# Patient Record
Sex: Female | Born: 1995 | Race: White | Hispanic: No | Marital: Married | Smoking: Never smoker
Health system: Southern US, Community
[De-identification: ages and names within clinical notes are randomized; demographics above are authoritative.]

## PROBLEM LIST (undated history)

## (undated) DIAGNOSIS — R112 Nausea with vomiting, unspecified: Secondary | ICD-10-CM

## (undated) DIAGNOSIS — T4145XA Adverse effect of unspecified anesthetic, initial encounter: Secondary | ICD-10-CM

## (undated) DIAGNOSIS — T8859XA Other complications of anesthesia, initial encounter: Secondary | ICD-10-CM

## (undated) DIAGNOSIS — R7303 Prediabetes: Secondary | ICD-10-CM

## (undated) DIAGNOSIS — E0590 Thyrotoxicosis, unspecified without thyrotoxic crisis or storm: Secondary | ICD-10-CM

## (undated) DIAGNOSIS — O26649 Intrahepatic cholestasis of pregnancy, unspecified trimester: Secondary | ICD-10-CM

## (undated) DIAGNOSIS — E282 Polycystic ovarian syndrome: Secondary | ICD-10-CM

## (undated) DIAGNOSIS — Z9889 Other specified postprocedural states: Secondary | ICD-10-CM

## (undated) HISTORY — DX: Intrahepatic cholestasis of pregnancy, unspecified trimester: O26.649

## (undated) HISTORY — PX: CHOLECYSTECTOMY: SHX55

## (undated) HISTORY — DX: Other specified postprocedural states: R11.2

## (undated) HISTORY — PX: TONSILLECTOMY: SUR1361

## (undated) HISTORY — DX: Other complications of anesthesia, initial encounter: T88.59XA

## (undated) HISTORY — PX: WISDOM TOOTH EXTRACTION: SHX21

## (undated) HISTORY — DX: Polycystic ovarian syndrome: E28.2

## (undated) HISTORY — DX: Other specified postprocedural states: Z98.890

---
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
## 2013-02-01 ENCOUNTER — Ambulatory Visit: Payer: Self-pay | Admitting: Pediatrics

## 2013-02-01 ENCOUNTER — Ambulatory Visit (INDEPENDENT_AMBULATORY_CARE_PROVIDER_SITE_OTHER): Payer: BC Managed Care – PPO | Admitting: Pediatrics

## 2013-02-01 VITALS — BP 106/80 | HR 88 | Ht 62.76 in | Wt 182.0 lb

## 2013-02-01 DIAGNOSIS — R7309 Other abnormal glucose: Secondary | ICD-10-CM

## 2013-02-01 DIAGNOSIS — R7303 Prediabetes: Secondary | ICD-10-CM

## 2013-02-01 DIAGNOSIS — E669 Obesity, unspecified: Secondary | ICD-10-CM

## 2013-02-01 DIAGNOSIS — N926 Irregular menstruation, unspecified: Secondary | ICD-10-CM

## 2013-02-01 DIAGNOSIS — Z6832 Body mass index (BMI) 32.0-32.9, adult: Secondary | ICD-10-CM

## 2013-02-01 MED ORDER — NORGESTIMATE-ETH ESTRADIOL 0.25-35 MG-MCG PO TABS: 1.0000 | ORAL_TABLET | Freq: Every day | ORAL | Status: DC

## 2013-02-01 NOTE — Progress Notes (Signed)
Adolescent Medicine Consultation Visit   History was provided by the patient and mother.  Yesenia Joseph is a 17 y.o. female who is here for evaluation for PCOS. PCP Confirmed? yes  BATES,MELISA K, MD  HPI:  Yesenia Joseph is a obese 17 y/o female with ADHD and pre-diabetes here for consultation for management of her irregular menstrual cycle and possible PCOS.  Jlynn reports her menstrual cycle starting in 7th grade, ~12-13 y/o. Reports irregular periods where she will have a period for "months" or several times a month but then will go 3 months without one.  When not prolonged will last about 1 week with cramping, back pain, and heavy bleeding.  Usually uses about 3 "overnight pads" during the day time. Her PCP has been managing her OCPs since 10th grade. Has been on "multiple" different OCPs to help regulate her peroids without any help. Was recently referred to OB/Gyn to evaluate for possible PCOS where blood work and U/S (not transvaginal) was done and Yesenia Joseph was told her U/S was "unconclusive." Currently taking Junel 1/20 OCPs (confirmed by pharmacy) and prior to that was on Ortho-Tri-Cylen.  Followed by Dr. Darnelle Bos for pre-diabetes, on Metformin 500 mg BID for 6 years.  Unknown Hgba1c.  Has long standing issues with weight management and fluxes in her weight. Reports exercising 5 days a week, was previously on the tennis team, softball team, and swim team in HS. Trying to eat pretty healthy, but does report emotional eating when stressed or when upset with not seeing results in her weight. Denies binge eating.  Never seen a nutritionist. Unknown if tested for thryoid medicine however has goiter and hyperthyroidism in her medical records and was told by Dr. Marylen Ponto that "she will eventually need thyroid medicine".  No thyroid labs to review. Mother and Eesha report worsening mood issues, gets emotional very easily, gets really upset over "anything", and can't control her emotions. Feels like "something missing"   but can't pinpoint it.  Mother feels like Yesenia Joseph is not happy with herself and not happy about her with weight.  Currently a rising sophomore at [employer], did well last year,  Dean's list both semesters but had to work very hard. Gets easily stressed and nervous regarding school and tests, does note getting panicky feelings and nauseous, SOB, heart racing and sweaty when stressed.  Works twice as hard to study and put a lot of pressure on herself.       History of dyslexia and ADHD and sensory processing.   Review of Systems:  Constitutional:   Denies fever  Vision: Denies concerns about vision  HENT: Denies concerns about hearing, snoring  Lungs:   Denies difficulty breathing  Heart:   Denies chest pain  Endocrine Denies hair loss, hair growth, acne, or temperature dysregulation.   Gastrointestinal:   Denies abdominal pain, constipation, diarrhea  Genitourinary:   Denies dysuria, discharge.   Neurologic:   Denies headaches   Patient's last menstrual period was 01/18/2013. On 3rd week, 4th day of OCP pack today.   Current Outpatient Prescriptions on File Prior to Visit  Medication Sig Dispense Refill  . lisdexamfetamine (VYVANSE) 50 MG capsule Take 50 mg by mouth every morning.      . metFORMIN (GLUCOPHAGE) 500 MG tablet Take 500 mg by mouth 2 (two) times daily with a meal.         No current facility-administered medications on file prior to visit.    Past Medical History:  Allergies  Allergen Reactions  .  Anesthetics, Amide   . Anesthetics, Ester   . Anesthetics, Halogenated    Past Medical History  Diagnosis Date  . Hyperthyroidism   . Pre-diabetes   Yesenia Joseph "never grew" and was referred to [employer] [facility]. No diagnosis.  Inconclusive on autism spectrum disorder. Started gaining weight at 17 y/o.   Family history:   Mother with endometriosis.  Maternal GGM with phlebitis and leg clots. Maternal aunt with leg clots.  24 y/o sister, Yesenia Joseph with IBS and connective tissues  disorder (blue sclera).  50 y/o sister, Yesenia Joseph with depression.    Social History: Lives at home for the summer with parents and 2 sisters.  Parental relations: good, close with mother.  Siblings: 2 sisters, 34 and 32 y/o both will be at [facility] with her. Very close.   School performance: Scientist, product/process development at [employer]  School Status: good  Nutrition/Eating Behaviors: Healthy eating behaviors, occasional emotional eating with junk food.  Sports/Exercise:  Exercises 5 days a week, lifting weights, walking, running. 3 sport athlete in HS.   With confidentiality discussed and parent out of the room:  - patient reports being comfortable and safe at school and at home.   Sexually active? No - Last STI Screening: none  - sexual partners in last year: none  - contraception use: none  - tobacco use or exposure:  Very occasional EtOH use at school, less than once a month, no binge drinking.  - historical and current drug use: none   Violence/Abuse: none   Screenings: The patient completed the Rapid Assessment for Adolescent Preventive Services screening questionnaire and the following topics were identified as risk factors and discussed: EtOH use.  In addition, the following topics were discussed as part of anticipatory guidance healthy eating, exercise.   PHQ-A completed and results listed in separate section. Suicidality was denied.   Additional Screening:   Completed PHQ-9 modified fro Adolescents (PHQ-A) Questions #1-9 score 7 Denies depression or sucidality.   Reported problems make it not difficult at all to complete activities of daily functioning.   Physical Exam:    Filed Vitals:   02/01/13 1603  BP: 106/80  Pulse: 88  Height: 5' 2.76" (1.594 m)  Weight: 182 lb (82.555 kg)   40.3% systolic and 93.5% diastolic of BP percentile by age, sex, and height.  GEN: Obese, well appearing adolescent female sitting comfortably. Easily emotional, crying with discussion of her mood.  Interactive, talkative. Good eye contact. In no acute distress.  HEENT: EOMI. Moist mucous membranes.   NECK: No thyroidmegaly or goiter appreciated. Small dorsocervical fat pad.  PULM:  Unlabored respirations.  Clear to auscultation bilaterally with no wheezes or crackles.  No accessory muscle use. CARDIO:  Regular rate and rhythm.  No murmurs.  2+ radial pulses GI:  Obese, soft, non tender, non distended.  Normoactive bowel sounds.  No masses.  No hepatosplenomegaly.   GU: Tanner Stage V female. Normal appearing external female genitalia. No discharge appreciated.   SKIN: No excessive hair growth to back, abdomen, face appreciated. No acne. NEURO: Alert and oriented. CN II-XII intact grossly. Normal gait. Normal tone. No focal deficits.   Assessment/Plan:  Aubrei is a 17 y/o adolescent female with ADHD and pre-diabetes here for management of her menstrual irregularity and concern for PCOS.   BMI 32.0-32.9,adult Difficulty with managing weight despite exercise and healthy eating, likely related to hyper insulin state as well intermittent emotional eating.   History of pre-diabetes state managed with short acting Metformin for  6 years with little improvement in weight.   Plan:  - Will get blood work today to evaluate for other contributing disease and/or complications from her pre-diabetic state including: HgbA1c, lipid panel, CMP, and TSH, and free T4.   -Refer to nutrition, Denny Levy at [facility]. - Consider further evaluation of obesity especially for Cushing's given exam findings. - Could consider Wellbutrin in the future given her history of emotional eating.     Menstrual irregularity Currently taking Junel which has a low dose of estrogen (20 mcg) that may not be supporting her bleeding and the progesterone source also is less anti-androgenic to assist with possible PCOS.   Sprintec would better benefit her due to higher estrogen (35 mcg) and a more  anti-androgenic progesterone source.   Uncertain previous work up for her PCOS, although was told it was "inconclusive." Will complete initial blood work today to evaluate for PCOS given her menstrual irregularities as well as rule out other etiologies such as prolactinoma or adrenal tumor.          Plan: - Once current OCP pack is complete, will switch to Sprintec.  - Will obtain blood work including: DHEA-S, testosterone panel, prolactin, FSH, LH.  Currently on 3rd week 4th day of OCP pack.  - Continue on current Metformin 500 mg BID, will consider increasing based on blood work.  - Discussed with mother and Aleyna the diagnosis of PCOS and further management.          - Follow-up visit in 2 weeks for blood work follow up, or sooner as needed.   Walden Field, MD [facility] Pediatric PGY-2 02/04/2013 10:57 AM  .

## 2013-02-01 NOTE — Patient Instructions (Signed)
-   We are going to switch Jarrett to a different birth control that would be better suited for possible PCOS called Sprintec.  You should continue your current birth control until you finish the pack then switch to the new Sprintec.  - We are also going to get some lab work to further investigate PCOS that will include hormone levels, hemoglobin A1c, electrolytes, lipids. - We have also made a referral to Denny Levy for nutrition, they will call to set up an appointment.   - Please return to clinic in 2 week for lab follow up with Dr. Marina Goodell.

## 2013-02-02 LAB — COMPREHENSIVE METABOLIC PANEL
ALT: 17 U/L (ref 0–35)
BUN: 11 mg/dL (ref 6–23)
CO2: 26 mEq/L (ref 19–32)
Calcium: 9.1 mg/dL (ref 8.4–10.5)
Chloride: 104 mEq/L (ref 96–112)
Creat: 0.84 mg/dL (ref 0.50–1.10)
Glucose, Bld: 77 mg/dL (ref 70–99)

## 2013-02-02 LAB — LIPID PANEL
Cholesterol: 145 mg/dL (ref 0–200)
LDL Cholesterol: 66 mg/dL (ref 0–99)
Total CHOL/HDL Ratio: 3 Ratio
VLDL: 31 mg/dL (ref 0–40)

## 2013-02-03 LAB — TESTOSTERONE, FREE: Testosterone, Free: 4.4 pg/mL (ref 0.6–6.8)

## 2013-02-03 LAB — TESTOSTERONE: Testosterone: 42 ng/dL — ABNORMAL HIGH (ref 15–40)

## 2013-02-03 LAB — TSH: TSH: 1.097 u[IU]/mL (ref 0.350–4.500)

## 2013-02-03 LAB — SEX HORMONE BINDING GLOBULIN: Sex Hormone Binding: 74 nmol/L (ref 18–114)

## 2013-02-04 ENCOUNTER — Encounter: Payer: Self-pay | Admitting: Pediatrics

## 2013-02-04 DIAGNOSIS — Z6832 Body mass index (BMI) 32.0-32.9, adult: Secondary | ICD-10-CM | POA: Insufficient documentation

## 2013-02-04 DIAGNOSIS — N926 Irregular menstruation, unspecified: Secondary | ICD-10-CM | POA: Insufficient documentation

## 2013-02-04 NOTE — Assessment & Plan Note (Addendum)
Currently taking Junel which has a low dose of estrogen (20 mcg) that may not be supporting her bleeding and the progesterone source also is less anti-androgenic to assist with possible PCOS.   Sprintec would better benefit her due to higher estrogen (35 mcg) and a more anti-androgenic progesterone source.   Uncertain previous work up for her PCOS, although was told it was "inconclusive." Will complete initial blood work today to evaluate for PCOS given her menstrual irregularities as well as rule out other etiologies such as prolactinoma or adrenal tumor.          Plan: - Once current OCP pack is complete, will switch to Sprintec.  - Will obtain blood work including: DHEA-S, testosterone panel, prolactin, FSH, LH.  Currently on 3rd week 4th day of OCP pack.  - Continue on current Metformin 500 mg BID, will consider increasing based on blood work.  - Discussed with mother and Oyuki the diagnosis of PCOS and further management.

## 2013-02-04 NOTE — Assessment & Plan Note (Addendum)
Difficulty with managing weight despite exercise and healthy eating, likely related to hyper insulin state as well intermittent emotional eating.   History of pre-diabetes state managed with short acting Metformin for 6 years with little improvement in weight.   Plan:  - Will get blood work today to evaluate for other contributing disease and/or complications from her pre-diabetic state including: HgbA1c, lipid panel, CMP, and TSH, and free T4.   -Refer to nutrition, Denny Levy at [facility]. - Consider further evaluation of obesity especially for Cushing's given exam findings. - Could consider Wellbutrin in the future given her history of emotional eating.

## 2013-02-07 NOTE — Progress Notes (Signed)
Referral and notes faxed to [facility] They will contact patient to schedule appointment.

## 2013-02-14 NOTE — Progress Notes (Signed)
I saw and evaluated the patient, performing the key elements of the service.  I developed the management plan that is described in the resident's note, and I agree with the content. 

## 2013-02-17 ENCOUNTER — Encounter: Payer: Self-pay | Admitting: Pediatrics

## 2013-02-17 ENCOUNTER — Ambulatory Visit (INDEPENDENT_AMBULATORY_CARE_PROVIDER_SITE_OTHER): Payer: BC Managed Care – PPO | Admitting: Pediatrics

## 2013-02-17 VITALS — BP 100/66 | Wt 186.0 lb

## 2013-02-17 DIAGNOSIS — E282 Polycystic ovarian syndrome: Secondary | ICD-10-CM | POA: Insufficient documentation

## 2013-02-17 MED ORDER — METFORMIN HCL ER (OSM) 1000 MG PO TB24: 1000.0000 mg | ORAL_TABLET | Freq: Every day | ORAL | Status: DC

## 2013-02-17 NOTE — Patient Instructions (Signed)
More information can be found at [email or web address]  Polycystic Ovarian Syndrome Polycystic ovarian syndrome is a condition with a number of problems. One problem is with the ovaries. The ovaries are organs located in the female pelvis, on each side of the uterus. Usually, during the menstrual cycle, an egg is released from 1 ovary every month. This is called ovulation. When the egg is fertilized, it goes into the womb (uterus), which allows for the growth of a baby. The egg travels from the ovary through the fallopian tube to the uterus. The ovaries also make the hormones estrogen and progesterone. These hormones help the development of a woman's breasts, body shape, and body hair. They also regulate the menstrual cycle and pregnancy. Sometimes, cysts form in the ovaries. A cyst is a fluid-filled sac. On the ovary, different types of cysts can form. The most common type of ovarian cyst is called a functional or ovulation cyst. It is normal, and often forms during the normal menstrual cycle. Each month, a woman's ovaries grow tiny cysts that hold the eggs. When an egg is fully grown, the sac breaks open. This releases the egg. Then, the sac which released the egg from the ovary dissolves. In one type of functional cyst, called a follicle cyst, the sac does not break open to release the egg. It may actually continue to grow. This type of cyst usually disappears within 1 to 3 months.  One type of cyst problem with the ovaries is called Polycystic Ovarian Syndrome (PCOS). In this condition, many follicle cysts form, but do not rupture and produce an egg. This health problem can affect the following:  Menstrual cycle.  Heart.  Obesity.  Cancer of the uterus.  Fertility.  Blood vessels.  Hair growth (face and body) or baldness.  Hormones.  Appearance.  High blood pressure.  Stroke.  Insulin production.  Inflammation of the liver.  Elevated blood cholesterol and triglycerides. CAUSES     No one knows the exact cause of PCOS.  Women with PCOS often have a mother or sister with PCOS. There is not yet enough proof to say this is inherited.  Many women with PCOS have a weight problem.  Researchers are looking at the relationship between PCOS and the body's ability to make insulin. Insulin is a hormone that regulates the change of sugar, starches, and other food into energy for the body's use, or for storage. Some women with PCOS make too much insulin. It is possible that the ovaries react by making too many female hormones, called androgens. This can lead to acne, excessive hair growth, weight gain, and ovulation problems.  Too much production of luteinizing hormone (LH) from the pituitary gland in the brain stimulates the ovary to produce too much female hormone (androgen). SYMPTOMS   Infrequent or no menstrual periods, and/or irregular bleeding.  Inability to get pregnant (infertility), because of not ovulating.  Increased growth of hair on the face, chest, stomach, back, thumbs, thighs, or toes.  Acne, oily skin, or dandruff.  Pelvic pain.  Weight gain or obesity, usually carrying extra weight around the waist.  Type 2 diabetes (this is the diabetes that usually does not need insulin).  High cholesterol.  High blood pressure.  Female-pattern baldness or thinning hair.  Patches of thickened and dark brown or black skin on the neck, arms, breasts, or thighs.  Skin tags, or tiny excess flaps of skin, in the armpits or neck area.  Sleep apnea (excessive snoring and breathing stops at  times while asleep).  Deepening of the voice.  Gestational diabetes when pregnant.  Increased risk of miscarriage with pregnancy. DIAGNOSIS  There is no single test to diagnose PCOS.   Your caregiver will:  Take a medical history.  Perform a pelvic exam.  Perform an ultrasound.  Check your female and female hormone levels.  Measure glucose or sugar levels in the blood.  Do  other blood tests.  If you are producing too many female hormones, your caregiver will make sure it is from PCOS. At the physical exam, your caregiver will want to evaluate the areas of increased hair growth. Try to allow natural hair growth for a few days before the visit.  During a pelvic exam, the ovaries may be enlarged or swollen by the increased number of small cysts. This can be seen more easily by vaginal ultrasound or screening, to examine the ovaries and lining of the uterus (endometrium) for cysts. The uterine lining may become thicker, if there has not been a regular period. TREATMENT  Because there is no cure for PCOS, it needs to be managed to prevent problems. Treatments are based on your symptoms. Treatment is also based on whether you want to have a baby or whether you need contraception.  Treatment may include:  Progesterone hormone, to start a menstrual period.  Birth control pills, to make you have regular menstrual periods.  Medicines to make you ovulate, if you want to get pregnant.  Medicines to control your insulin.  Medicine to control your blood pressure.  Medicine and diet, to control your high cholesterol and triglycerides in your blood.  Surgery, making small holes in the ovary, to decrease the amount of female hormone production. This is done through a long, lighted tube (laparoscope), placed into the pelvis through a tiny incision in the lower abdomen. Your caregiver will go over some of the choices with you. WOMEN WITH PCOS HAVE THESE CHARACTERISTICS:  High levels of female hormones called androgens.  An irregular or no menstrual cycle.  May have many small cysts in their ovaries. PCOS is the most common hormonal reproductive problem in women of childbearing age. WHY DO WOMEN WITH PCOS HAVE TROUBLE WITH THEIR MENSTRUAL CYCLE? Each month, about 20 eggs start to mature in the ovaries. As one egg grows and matures, the follicle breaks open to release the egg, so  it can travel through the fallopian tube for fertilization. When the single egg leaves the follicle, ovulation takes place. In women with PCOS, the ovary does not make all of the hormones it needs for any of the eggs to fully mature. They may start to grow and accumulate fluid, but no one egg becomes large enough. Instead, some may remain as cysts. Since no egg matures or is released, ovulation does not occur and the hormone progesterone is not made. Without progesterone, a woman's menstrual cycle is irregular or absent. Also, the cysts produce female hormones, which continue to prevent ovulation.  Document Released: 10/23/2004 Document Revised: 09/21/2011 Document Reviewed: 05/17/2009 [facility] Patient Information 2014 [place], [place].

## 2013-02-17 NOTE — Progress Notes (Signed)
I saw and evaluated the patient, performing the key elements of the service.  I developed the management plan that is described in the resident's note, and I agree with the content. 

## 2013-02-17 NOTE — Progress Notes (Signed)
Adolescent Medicine Consultation Follow-Up Visit Yesenia Joseph was referred by PCP for evaluation of possible PCOS  [name], MD PCP Confirmed?  yes   History was provided by the patient and mother.  Yesenia Joseph is a 17 y.o. female who is here today for follow-up after lab work last week.    HPI:  History of irregular periods and insulin resistance, has been on OCPs and metformin for several years.  Has gotten Sprintec but not started yet.  Interested in switching to once daily metformin dosing.  No new complaints or issues.  Will be returning to college next week.  Review of Systems:  Constitutional:   Denies fever  Vision: Denies concerns about vision  HENT: Denies concerns about hearing, snoring  Lungs:   Denies difficulty breathing  Heart:   Denies chest pain  Gastrointestinal:   Denies abdominal pain, constipation, diarrhea  Genitourinary:   Denies dysuria  Neurologic:   Denies headaches   Social History: Pregnancy Prevention: OCPs Menstrual History: Patient's last menstrual period was 01/18/2013.    Patient Active Problem List   Diagnosis Date Noted  . Menstrual irregularity 02/04/2013  . BMI 32.0-32.9,adult 02/04/2013  . Pre-diabetes 12/01/2010  . Goiter, unspecified 12/01/2010  . Obesity 12/01/2010    Current Outpatient Prescriptions on File Prior to Visit  Medication Sig Dispense Refill  . lisdexamfetamine (VYVANSE) 50 MG capsule Take 50 mg by mouth every morning.      . norgestimate-ethinyl estradiol (SPRINTEC 28) 0.25-35 MG-MCG tablet Take 1 tablet by mouth daily.  1 Package  11   No current facility-administered medications on file prior to visit.    The following portions of the patient's history were reviewed and updated as appropriate: allergies, current medications, past family history, past medical history, past social history, past surgical history and problem list.   Physical Exam:  Gen: awake, alert, interactive; no acute distress Neck:  supple without lymphadenopathy CV: RRR, no murmurs appreciated, normal peripheral pulses and perfusion Resp: Comfortable WOB without retractions.  No wheezing, rales, or rhonchi Abd: Soft, nontender, nondistended.  No palpable masses or organomegaly Skin: No rashes or lesions noted.  No acne.  Striae noted on arms. Neuro/psych: Grossly non-focal and appropriate for age.    Filed Vitals:   02/17/13 1033  BP: 100/66  Weight: 186 lb (84.369 kg)    Recent Results (from the past 2160 hour(s))  POCT I-STAT, CHEM 8     Status: None   Collection Time    12/12/12  8:07 PM      Result Value Range   Sodium 141  135 - 145 mEq/L   Potassium 4.1  3.5 - 5.1 mEq/L   Chloride 106  96 - 112 mEq/L   BUN 9  6 - 23 mg/dL   Creatinine, Ser 1.91  0.50 - 1.10 mg/dL   Glucose, Bld 89  70 - 99 mg/dL   Calcium, Ion 4.78  2.95 - 1.23 mmol/L   TCO2 29  0 - 100 mmol/L   Hemoglobin 13.3  12.0 - 15.0 g/dL   HCT 62.1  30.8 - 65.7 %  POCT PREGNANCY, URINE     Status: None   Collection Time    12/12/12  9:10 PM      Result Value Range   Preg Test, Ur NEGATIVE  NEGATIVE   Comment:            THE SENSITIVITY OF THIS     METHODOLOGY IS >24 mIU/mL  COMPREHENSIVE METABOLIC  PANEL     Status: None   Collection Time    02/02/13  3:00 PM      Result Value Range   Sodium 137  135 - 145 mEq/L   Potassium 4.3  3.5 - 5.3 mEq/L   Chloride 104  96 - 112 mEq/L   CO2 26  19 - 32 mEq/L   Glucose, Bld 77  70 - 99 mg/dL   BUN 11  6 - 23 mg/dL   Creat 2.53  6.64 - 4.03 mg/dL   Total Bilirubin 0.4  0.3 - 1.2 mg/dL   Alkaline Phosphatase 53  39 - 117 U/L   AST 18  0 - 37 U/L   ALT 17  0 - 35 U/L   Total Protein 6.5  6.0 - 8.3 g/dL   Albumin 4.2  3.5 - 5.2 g/dL   Calcium 9.1  8.4 - 47.4 mg/dL  TESTOSTERONE     Status: Abnormal   Collection Time    02/02/13  3:00 PM      Result Value Range   Testosterone 42 (*) 15 - 40 ng/dL   Comment:           Tanner Stage       Female              Female                   I               < 30 ng/dL        < 10 ng/dL                   II             < 150 ng/dL       < 30 ng/dL                   III            100-320 ng/dL     < 35 ng/dL                   IV             200-970 ng/dL     25-95 ng/dL                   V/Adult        300-890 ng/dL     63-87 ng/dL        TESTOSTERONE, FREE     Status: None   Collection Time    02/02/13  3:00 PM      Result Value Range   Testosterone, Free 4.4  0.6 - 6.8 pg/mL   Comment:       The concentration of free testosterone is derived from a mathematical     expression based on constants for the binding of testosterone to sex     hormone-binding globulin and albumin.  TESTOSTERONE, % FREE     Status: None   Collection Time    02/02/13  3:00 PM      Result Value Range   Testosterone-% Freee. 1.0  0.4 - 2.4 %  SEX HORMONE BINDING GLOBULIN     Status: None   Collection Time    02/02/13  3:00 PM      Result Value Range   Sex Hormone Binding 74  18 - 114 nmol/L  DHEA-SULFATE  Status: None   Collection Time    02/02/13  3:00 PM      Result Value Range   DHEA-SO4 245  35 - 430 ug/dL  HEMOGLOBIN J4N     Status: None   Collection Time    02/02/13  3:00 PM      Result Value Range   Hemoglobin A1C 4.6  <5.7 %   Comment:                                                                            According to the ADA Clinical Practice Recommendations for 2011, when     HbA1c is used as a screening test:             >=6.5%   Diagnostic of Diabetes Mellitus                (if abnormal result is confirmed)           5.7-6.4%   Increased risk of developing Diabetes Mellitus           References:Diagnosis and Classification of Diabetes Mellitus,Diabetes     Care,2011,34(Suppl 1):S62-S69 and Standards of Medical Care in             Diabetes - 2011,Diabetes Care,2011,34 (Suppl 1):S11-S61.         Mean Plasma Glucose 85  <117 mg/dL  LIPID PANEL     Status: Abnormal   Collection Time    02/02/13  3:00 PM      Result Value  Range   Cholesterol 145  0 - 200 mg/dL   Comment: ATP III Classification:           < 200        mg/dL        Desirable          200 - 239     mg/dL        Borderline High          >= 240        mg/dL        High         Triglycerides 153 (*) <150 mg/dL   HDL 48  >82 mg/dL   Total CHOL/HDL Ratio 3.0     VLDL 31  0 - 40 mg/dL   LDL Cholesterol 66  0 - 99 mg/dL   Comment:       Total Cholesterol/HDL Ratio:CHD Risk                            Coronary Heart Disease Risk Table                                            Men       Women              1/2 Average Risk              3.4        3.3  Average Risk              5.0        4.4               2X Average Risk              9.6        7.1               3X Average Risk             23.4       11.0     Use the calculated Patient Ratio above and the CHD Risk table      to determine the patient's CHD Risk.     ATP III Classification (LDL):           < 100        mg/dL         Optimal          100 - 129     mg/dL         Near or Above Optimal          130 - 159     mg/dL         Borderline High          160 - 189     mg/dL         High           > 190        mg/dL         Very High        PROLACTIN     Status: None   Collection Time    02/02/13  3:00 PM      Result Value Range   Prolactin 7.3     Comment:      Reference Ranges:                      Female:                       2.1 -  17.1 ng/ml                      Female:   Pregnant          9.7 - 208.5 ng/mL                                Non Pregnant      2.8 -  29.2 ng/mL                                Post Menopausal   1.8 -  20.3 ng/mL                         TSH     Status: None   Collection Time    02/02/13  3:00 PM      Result Value Range   TSH 1.097  0.350 - 4.500 uIU/mL  LUTEINIZING HORMONE     Status: None   Collection Time    02/02/13  3:00 PM      Result Value Range   LH 2.9     Comment: Reference Ranges:  Female:     20 - 70 Years            1.5 -  9.3 mIU/mL                           > 70 Years           3.1 - 34.6 mIU/mL              Female:   Follicular Phase        1.9 - 12.5 mIU/mL                        Midcycle                8.7 - 76.3 mIU/mL                        Luteal Phase            0.5 - 16.9 mIU/mL                        Post Menopausal        15.9 - 54.0 mIU/mL                        Pregnant                    <  1.5 mIU/mL                        Contraceptives          0.7 -  5.6 mIU/mL              Children:                             <  6.0 mIU/mL        FOLLICLE STIMULATING HORMONE     Status: None   Collection Time    02/02/13  3:00 PM      Result Value Range   FSH 3.7     Comment: Reference Ranges:              Female:                         1.4 -  18.1 mIU/mL              Female:   Follicular Phase    2.5 -  10.2 mIU/mL                        MidCycle Peak       3.4 -  33.4 mIU/mL                        Luteal Phase        1.5 -   9.1 mIU/mL                        Post Menopausal    23.0 - 116.3 mIU/mL                        Pregnant                <  0.3 mIU/mL  T4, FREE     Status: None   Collection Time    02/02/13  3:00 PM      Result Value Range   Free T4 1.30  0.80 - 1.80 ng/dL    Assessment/Plan:  17 yo with obesity, irregular periods, history of insulin resistance.  Slightly elevated testosterone on labs while on OCPs, remainder of lab results within normal limits.  Went over PCOS and multi-pronged treatment approach that includes medications and lifestyle changes.  Printed information about PCOS and healthy eating suggestions provided. 1. Continue Spintec OCP 2. Will switch to metformin ER 1000 mg daily with dinner.  May try to increase to 1500 mg eventually 3. F/u wth Dr. Nestor Ramp and nutritionist during fall break from college in about 2 months, sooner if questions/concerns develop  Kathi Simpers, MD PGY-3 >30 mins spent in clinical encounter with >50% of time in counseling

## 2013-04-07 ENCOUNTER — Ambulatory Visit: Payer: BC Managed Care – PPO | Admitting: Pediatrics

## 2013-04-20 ENCOUNTER — Encounter: Payer: Self-pay | Admitting: *Deleted

## 2013-04-20 ENCOUNTER — Encounter: Payer: BC Managed Care – PPO | Attending: Pediatrics | Admitting: *Deleted

## 2013-04-20 VITALS — Ht 62.0 in | Wt 190.0 lb

## 2013-04-20 DIAGNOSIS — R7303 Prediabetes: Secondary | ICD-10-CM

## 2013-04-20 DIAGNOSIS — R7309 Other abnormal glucose: Secondary | ICD-10-CM | POA: Insufficient documentation

## 2013-04-20 DIAGNOSIS — Z6832 Body mass index (BMI) 32.0-32.9, adult: Secondary | ICD-10-CM

## 2013-04-20 DIAGNOSIS — E282 Polycystic ovarian syndrome: Secondary | ICD-10-CM | POA: Insufficient documentation

## 2013-04-20 DIAGNOSIS — Z713 Dietary counseling and surveillance: Secondary | ICD-10-CM | POA: Insufficient documentation

## 2013-04-20 DIAGNOSIS — E669 Obesity, unspecified: Secondary | ICD-10-CM | POA: Insufficient documentation

## 2013-04-20 NOTE — Progress Notes (Signed)
  Medical Nutrition Therapy:  Appt start time: 1000 end time:  1100.   Assessment:  Primary concerns today: Yesenia Joseph is here for nutrition counseling pertaining to obesity and PCOS.  She was recently diagnosed with PCOS.  She reports being short and overweight as an adolescent and teenager.  She has seen providers over the years, but didn't get much actual nutrition advice.  She was referred to Dr. Marina Goodell for evaluation and then was diagnosed.  She found her visit with Dr. Marina Goodell very helpful, but struggles with a negative body image and poor self-esteem.  She complains of craving carbohydrates, mindless eating,emotional eating, and feelings of hunger  MEDICATIONS: see list   DIETARY INTAKE:  Usual eating pattern includes 3 meals and 1-3 snacks per day.  Everyday foods include refined carbohydrates.  Avoided foods include many fruits and vegetables.    24-hr recall:  B ( AM): chocolate chip waffles with syrup  Snk ( AM): not usually  L ( PM): chick fil a with fries Snk ( PM): fruit gummies D ( PM): spaghetti Snk ( PM): chips Beverages: chocolate milk or water  Usual physical activity: goes to gym 4 times a week.  Runs or lift weights  Estimated energy needs: 1800 calories 200 g carbohydrates 135 g protein 50 g fat    Nutritional Diagnosis:  [place]-2.1 Inpaired nutrition utilization As related to carbohydrates.  As evidenced by PCOS and pre-diabetes.    Intervention:  Nutrition counseling provided. Discussed physiology of carbohydrate metabolism and how it is affected by PCOS.  Discussed hormonal imbalances associated with PCOS and how those imbalances present themselves with hirsutism, body acne, menstrual irregularity, obesity, and poor glycemic control.  Dicussed possible increased risk for CVD and the importance of nutrition management for overall health.   Recommended the Mediterranean style eating plan: MUFAs, whole grains, fruits, vegetables, legumes, lean proteins, and low-fat  dairy.  Recommended limiting refined carbohydrates and concentrated sweets.  Suggested regularly scheduled meals and snacks and to avoid meal skipping.  Recommended fiber and lean protein with all meals and to include non-starchy vegetables with most meals.    Recommended regular physical activity of 150 minutes/week.  Discussed reading food labels: focusing on fiber and limited sugars and saturated, trans fats.  Suggested supplement of inositol:  D-chiro-inositol reduces circulating insulin, decreases serum androgens, and ameliorates some of the metabolic abnormalities (increased blood pressure and hypertriglyceridemia) of syndrome X   Handouts given during visit include:  Mediterranean diet   Reading food labels  Monitoring/Evaluation:  Dietary intake, exercise, and body weight in 2 month(s).

## 2013-04-28 ENCOUNTER — Ambulatory Visit: Payer: BC Managed Care – PPO | Admitting: Pediatrics

## 2013-05-19 ENCOUNTER — Ambulatory Visit: Payer: BC Managed Care – PPO | Admitting: Pediatrics

## 2013-06-29 ENCOUNTER — Encounter: Payer: BC Managed Care – PPO | Attending: Pediatrics | Admitting: *Deleted

## 2013-06-29 ENCOUNTER — Encounter: Payer: Self-pay | Admitting: *Deleted

## 2013-06-29 DIAGNOSIS — Z713 Dietary counseling and surveillance: Secondary | ICD-10-CM | POA: Insufficient documentation

## 2013-06-29 DIAGNOSIS — R7309 Other abnormal glucose: Secondary | ICD-10-CM | POA: Insufficient documentation

## 2013-06-29 DIAGNOSIS — E282 Polycystic ovarian syndrome: Secondary | ICD-10-CM | POA: Insufficient documentation

## 2013-06-29 DIAGNOSIS — E669 Obesity, unspecified: Secondary | ICD-10-CM | POA: Insufficient documentation

## 2013-06-29 DIAGNOSIS — R7303 Prediabetes: Secondary | ICD-10-CM

## 2013-06-29 DIAGNOSIS — Z6832 Body mass index (BMI) 32.0-32.9, adult: Secondary | ICD-10-CM

## 2013-06-29 NOTE — Progress Notes (Signed)
  Medical Nutrition Therapy:  Appt start time: 0915 end time:  0945.  Assessment:  Kallan is here for a follow up appointment pertaining to PCOS.  She was diagnosed this past summer and I saw her this fall for nutrition counseling.  She has made many changes to her diet: Reduced milk from 2% to 1% and cut out chocolate milk.  She has cut back on her juice and is trying to snack more on fruit.  she is trying more vegetables, but she doesn't like them a lot, but she still eats them.  Her and her sister (whom she lives with at college) have been trying to eat healthier at home.  She has been eating whole wheat bagel thins for breakfast.  She reports being more conscious about how foods affect her health and is trying to make better choices.  She's cut back on the sugary foods, increased her complex carbohydrates, cut back on fast food, and it doing really well.  She has an improved body-image, and it in a better place emotionally now too.    Her concerns today are not liking vegetables and what to snack on late at night while she's studying   MEDICATIONS: see list   DIETARY INTAKE:  Usual eating pattern includes 3 meals and 1-3 snacks per day.  Everyday foods include refined carbohydrates.  Avoided foods include many fruits and vegetables.    24-hr recall:  B ( AM): whole wheat bagel thin with cream cheese with 1% milk Snk ( AM): not usually  L ( PM): salads or sandwiches on wheat bread with [place] and mayo Snk ( PM): fruit gummies D ( PM): chicken or pasta. Likes corn Snk ( PM): struggles here Beverages: [place] for tots juice, water, 1% milk, juicy juice  Usual physical activity: goes to gym 4 times a week.  Runs or lift weights.  Has plans to walk on treadmill at home and dad is making gym at their outbuilding .    Estimated energy needs: 1800 calories 200 g carbohydrates 135 g protein 50 g fat    Nutritional Diagnosis:  [place]-2.1 Inpaired nutrition utilization As related to  carbohydrates.  As evidenced by PCOS and pre-diabetes.    Intervention:   Suggested supplement of omega 3 fatty acid as well as a multivitamin.  We discussed various methods of preparing vegetables to make them more palatable, different late night snack options, and staying hydrated.  Addasyn has made huge changes, she feels better about her diet and her body and she's well on her way towards healthy living   Monitoring/Evaluation:  Dietary intake, exercise, and body weight prn

## 2013-06-29 NOTE — Patient Instructions (Signed)
Quest protein chips form [employer] Low fat popcorn Drink plenty of water throughout the day and night  All snacks should contain either protein or fiber: nuts, nut butter on wheat bread of apple or graham cracker.  Cheese sticks, etc  Try roasted vegetables instead of steamed: olive oil, salt, pepper, or other seasonings Veggies with dip Salads  Try V8 juice  07/25/13 on [employer] at 10 pm for Whitney Thore's new tv show (fat girl dancing).  [email or web address]

## 2013-06-30 ENCOUNTER — Encounter: Payer: Self-pay | Admitting: Pediatrics

## 2013-06-30 ENCOUNTER — Ambulatory Visit (INDEPENDENT_AMBULATORY_CARE_PROVIDER_SITE_OTHER): Payer: BC Managed Care – PPO | Admitting: Pediatrics

## 2013-06-30 VITALS — BP 128/76 | Ht 62.76 in | Wt 187.0 lb

## 2013-06-30 DIAGNOSIS — E282 Polycystic ovarian syndrome: Secondary | ICD-10-CM

## 2013-06-30 DIAGNOSIS — E669 Obesity, unspecified: Secondary | ICD-10-CM

## 2013-06-30 MED ORDER — METFORMIN HCL ER (OSM) 500 MG PO TB24: 500.0000 mg | ORAL_TABLET | Freq: Every day | ORAL | Status: DC

## 2013-06-30 MED ORDER — CLOTRIMAZOLE 1 % EX CREA: 1.0000 "application " | TOPICAL_CREAM | Freq: Three times a day (TID) | CUTANEOUS | Status: DC

## 2013-06-30 MED ORDER — FLUCONAZOLE 150 MG PO TABS: 150.0000 mg | ORAL_TABLET | Freq: Once | ORAL | Status: DC

## 2013-06-30 NOTE — Patient Instructions (Signed)
Polycystic Ovarian Syndrome Polycystic ovarian syndrome is a condition with a number of problems. One problem is with the ovaries. The ovaries are organs located in the female pelvis, on each side of the uterus. Usually, during the menstrual cycle, an egg is released from 1 ovary every month. This is called ovulation. When the egg is fertilized, it goes into the womb (uterus), which allows for the growth of a baby. The egg travels from the ovary through the fallopian tube to the uterus. The ovaries also make the hormones estrogen and progesterone. These hormones help the development of a woman's breasts, body shape, and body hair. They also regulate the menstrual cycle and pregnancy. Sometimes, cysts form in the ovaries. A cyst is a fluid-filled sac. On the ovary, different types of cysts can form. The most common type of ovarian cyst is called a functional or ovulation cyst. It is normal, and often forms during the normal menstrual cycle. Each month, a woman's ovaries grow tiny cysts that hold the eggs. When an egg is fully grown, the sac breaks open. This releases the egg. Then, the sac which released the egg from the ovary dissolves. In one type of functional cyst, called a follicle cyst, the sac does not break open to release the egg. It may actually continue to grow. This type of cyst usually disappears within 1 to 3 months.  One type of cyst problem with the ovaries is called Polycystic Ovarian Syndrome (PCOS). In this condition, many follicle cysts form, but do not rupture and produce an egg. This health problem can affect the following:  Menstrual cycle.  Heart.  Obesity.  Cancer of the uterus.  Fertility.  Blood vessels.  Hair growth (face and body) or baldness.  Hormones.  Appearance.  High blood pressure.  Stroke.  Insulin production.  Inflammation of the liver.  Elevated blood cholesterol and triglycerides. CAUSES   No one knows the exact cause of PCOS.  Women with  PCOS often have a mother or sister with PCOS. There is not yet enough proof to say this is inherited.  Many women with PCOS have a weight problem.  Researchers are looking at the relationship between PCOS and the body's ability to make insulin. Insulin is a hormone that regulates the change of sugar, starches, and other food into energy for the body's use, or for storage. Some women with PCOS make too much insulin. It is possible that the ovaries react by making too many female hormones, called androgens. This can lead to acne, excessive hair growth, weight gain, and ovulation problems.  Too much production of luteinizing hormone (LH) from the pituitary gland in the brain stimulates the ovary to produce too much female hormone (androgen). SYMPTOMS   Infrequent or no menstrual periods, and/or irregular bleeding.  Inability to get pregnant (infertility), because of not ovulating.  Increased growth of hair on the face, chest, stomach, back, thumbs, thighs, or toes.  Acne, oily skin, or dandruff.  Pelvic pain.  Weight gain or obesity, usually carrying extra weight around the waist.  Type 2 diabetes (this is the diabetes that usually does not need insulin).  High cholesterol.  High blood pressure.  Female-pattern baldness or thinning hair.  Patches of thickened and dark brown or black skin on the neck, arms, breasts, or thighs.  Skin tags, or tiny excess flaps of skin, in the armpits or neck area.  Sleep apnea (excessive snoring and breathing stops at times while asleep).  Deepening of the voice.  Gestational diabetes when pregnant.  Increased risk of miscarriage with pregnancy. DIAGNOSIS  There is no single test to diagnose PCOS.   Your caregiver will:  Take a medical history.  Perform a pelvic exam.  Perform an ultrasound.  Check your female and female hormone levels.  Measure glucose or sugar levels in the blood.  Do other blood tests.  If you are producing too many  female hormones, your caregiver will make sure it is from PCOS. At the physical exam, your caregiver will want to evaluate the areas of increased hair growth. Try to allow natural hair growth for a few days before the visit.  During a pelvic exam, the ovaries may be enlarged or swollen by the increased number of small cysts. This can be seen more easily by vaginal ultrasound or screening, to examine the ovaries and lining of the uterus (endometrium) for cysts. The uterine lining may become thicker, if there has not been a regular period. TREATMENT  Because there is no cure for PCOS, it needs to be managed to prevent problems. Treatments are based on your symptoms. Treatment is also based on whether you want to have a baby or whether you need contraception.  Treatment may include:  Progesterone hormone, to start a menstrual period.  Birth control pills, to make you have regular menstrual periods.  Medicines to make you ovulate, if you want to get pregnant.  Medicines to control your insulin.  Medicine to control your blood pressure.  Medicine and diet, to control your high cholesterol and triglycerides in your blood.  Surgery, making small holes in the ovary, to decrease the amount of female hormone production. This is done through a long, lighted tube (laparoscope), placed into the pelvis through a tiny incision in the lower abdomen. Your caregiver will go over some of the choices with you. WOMEN WITH PCOS HAVE THESE CHARACTERISTICS:  High levels of female hormones called androgens.  An irregular or no menstrual cycle.  May have many small cysts in their ovaries. PCOS is the most common hormonal reproductive problem in women of childbearing age. WHY DO WOMEN WITH PCOS HAVE TROUBLE WITH THEIR MENSTRUAL CYCLE? Each month, about 20 eggs start to mature in the ovaries. As one egg grows and matures, the follicle breaks open to release the egg, so it can travel through the fallopian tube for  fertilization. When the single egg leaves the follicle, ovulation takes place. In women with PCOS, the ovary does not make all of the hormones it needs for any of the eggs to fully mature. They may start to grow and accumulate fluid, but no one egg becomes large enough. Instead, some may remain as cysts. Since no egg matures or is released, ovulation does not occur and the hormone progesterone is not made. Without progesterone, a woman's menstrual cycle is irregular or absent. Also, the cysts produce female hormones, which continue to prevent ovulation.  Document Released: 10/23/2004 Document Revised: 09/21/2011 Document Reviewed: 12/15/2012 [facility] Patient Information 2014 [place], [place].

## 2013-06-30 NOTE — Progress Notes (Signed)
Adolescent Medicine Consultation Follow-Up Visit Yesenia Joseph  is a 17 y.o. female referred by Santa Genera here today for follow-up of PCOS.   PCP Confirmed?  yes  Joseph,Yesenia K, MD   History was provided by the patient.  Chart review:  Last seen by Dr. Marina Joseph on 02/17/13.  Treatment plan at last visit was to exercise, do once daily Metformin 1g qpm and continue Sprintec and Vyvanse.   HPI:  Yesenia Joseph is a 17yo F (who goes to [employer]) who is here for f/u of PCOS. She notes that she notices a difference since her last visit. She has been taking Metformin 1g qhs and doing very well with it. With this dose, she occasionally gets stomach aches and diarrhea with nausea, but overall is still tolerating it well. Additionally, she has been working hard at other lifestyle modifications. She has seen the nutritionist she has more strategies for dealing with her previous habits of eating more with stress, though this is not always easy. However, she is more aware of it and finding alternatives. Lives with older sister in college, who is also very good about helping her redirect unhealthy eating behaviors and encouraging her to be more physically active.   She has had no breakouts/acne or unwanted hair grown. With regard to her menstrual cycle, for the past two weeks had 2 days of bleeding/spotting. Can tell that they are "trying to become more regular". Last menstrual cycle was at the end of Thanksgiving.  Menstrual History: Patient's last menstrual period was 06/26/2013.  ROS  Problem List Reviewed:  yes Medication List Reviewed:   yes  Sleep:  Does ok Appetite: maybe slightly decreased School: [employer]  Physical Exam:  Filed Vitals:   06/30/13 1044  BP: 128/76  Height: 5' 2.76" (1.594 m)  Weight: 187 lb (84.823 kg)   Wt Readings from Last 3 Encounters:  06/30/13 187 lb (84.823 kg) (96%*, Z = 1.73)  04/20/13 190 lb (86.183 kg) (96%*, Z = 1.79)  02/17/13 186 lb (84.369 kg) (96%*, Z = 1.72)   *  Growth percentiles are based on CDC 2-20 Years data.   BP 128/76  Ht 5' 2.76" (1.594 m)  Wt 187 lb (84.823 kg)  BMI 33.38 kg/m2  LMP 06/26/2013 Body mass index: body mass index is 33.38 kg/(m^2). 97.3% systolic and 89.2% diastolic of BP percentile by age, sex, and height. 125/81 is approximately the 95th BP percentile reading.   Gen: caucasian teen, obese, but NAD HEENT: EOMI, PERRL, OP clear Cardio: rrr, no r/m/g Resp: normal wob, no w/r/r Abd: obese, nabs, soft nt/nd Extr: warm and well perfused  Assessment/Plan: Yesenia Joseph is a 17yo F with PCOS. She has done well with the Metformin and has been working hard at lifestyle changes.   1) PCOS: She has lost 3 pounds since her last visit and developed some better behaviors around eating and exercise. No clinical indications of hyperandrogenism.  - Increase Metformin to 1500mg  qhs.  - Continue dietary modifications. Continue Sprintec.   This patient was seen in conjunction with Dr. Delorse Joseph who is in agreement with the above assessment and plan.

## 2013-07-12 NOTE — Progress Notes (Signed)
I reviewed with the resident the medical history and the resident's findings on physical examination.  I discussed with the resident the patient's diagnosis and concur with the treatment plan as documented in the resident's note.   

## 2013-09-11 ENCOUNTER — Telehealth: Payer: Self-pay | Admitting: Pediatrics

## 2013-09-11 NOTE — Telephone Encounter (Signed)
Please call Yesenia Joseph she running out of meds

## 2013-09-12 NOTE — Telephone Encounter (Signed)
Patient is running out of Metformin.  She will be home next week so I scheduled her for Thursday the 12th @ 1045.  Please advise.

## 2013-09-12 NOTE — Telephone Encounter (Signed)
Yesenia MortimerWanda, Please call and advise that patient needs to be scheduled and we need to know the medication she is running out of with the dose they are taking.  She is on more than one med.  Please be very descriptive in your messages.

## 2013-09-21 ENCOUNTER — Ambulatory Visit (INDEPENDENT_AMBULATORY_CARE_PROVIDER_SITE_OTHER): Payer: BC Managed Care – PPO | Admitting: Pediatrics

## 2013-09-21 ENCOUNTER — Encounter: Payer: Self-pay | Admitting: Pediatrics

## 2013-09-21 VITALS — BP 106/70 | Ht 62.8 in | Wt 188.0 lb

## 2013-09-21 DIAGNOSIS — E282 Polycystic ovarian syndrome: Secondary | ICD-10-CM

## 2013-09-21 DIAGNOSIS — Z113 Encounter for screening for infections with a predominantly sexual mode of transmission: Secondary | ICD-10-CM

## 2013-09-21 MED ORDER — NORETHINDRONE-ETH ESTRADIOL 1-35 MG-MCG PO TABS: 1.0000 | ORAL_TABLET | Freq: Every day | ORAL | Status: DC

## 2013-09-21 MED ORDER — METFORMIN HCL ER (OSM) 500 MG PO TB24: 1500.0000 mg | ORAL_TABLET | Freq: Every day | ORAL | Status: DC

## 2013-09-21 NOTE — Progress Notes (Signed)
Adolescent Medicine Consultation Follow-Up Visit Yesenia Joseph  is a 18 y.o. female referred by Dr. Jenne PaneBates here today for follow-up of PCOS.   PCP Confirmed?  yes  BATES,MELISA K, MD   History was provided by the patient.  Chart review:  Last seen by Dr. Marina GoodellPerry on 06/30/13.  Treatment plan at last visit was to continue Sprintec and Metformin 1500mg  daily, and to continue on lifestyle modifications.   Patient's last menstrual period was 09/11/2013.   HPI:  Yesenia Joseph reports that since the last clinic visit, she has continued to have irregular menstrual cycles and frequent spotting in between cycles despite taking daily Sprintec. She reports that some months she will have no withdrawal bleed during the week when she doesn't take pills, and other months she will have a 4-6 days of heavy bleeding and cramping during a week that she is taking pills. She also will have days when she will have 1-2 days of light spotting. She is frustrated by the irregularity and wonders if there is any changes that need to be made to her current OCP regimen. In terms of lifestyle changes, she reports that she has made a significant change by not drinking anything besides water or V8 juice, and eats a healthy diet. She walks 45 minutes on the treadmill at a steep incline 3-4 times/week. She also walks her dog every day. She denies that she has had any increased acne or facial hair. She has been taking the metformin daily without any nausea or other side effects.   ROS  Current Outpatient Prescriptions on File Prior to Visit  Medication Sig Dispense Refill  . lisdexamfetamine (VYVANSE) 50 MG capsule Take 50 mg by mouth every morning.      . metformin (FORTAMET) 500 MG (OSM) 24 hr tablet Take 1 tablet (500 mg total) by mouth daily with breakfast.  90 tablet  3  . norgestimate-ethinyl estradiol (SPRINTEC 28) 0.25-35 MG-MCG tablet Take 1 tablet by mouth daily.  1 Package  11  . Ascorbic Acid (VITAMIN C) 100 MG tablet Take 100 mg  by mouth daily.      . clotrimazole (LOTRIMIN) 1 % cream Apply 1 application topically 3 (three) times daily.  30 g  1  . fluconazole (DIFLUCAN) 150 MG tablet Take 1 tablet (150 mg total) by mouth once.  1 tablet  0   No current facility-administered medications on file prior to visit.    Patient Active Problem List   Diagnosis Date Noted  . PCOS (polycystic ovarian syndrome) 02/17/2013  . Menstrual irregularity 02/04/2013  . BMI 32.0-32.9,adult 02/04/2013  . Pre-diabetes 12/01/2010  . Goiter, unspecified 12/01/2010  . Obesity 12/01/2010    Social History: Sleep:  8-9 hours Eating Habits: balanced Screen Time:  2-5 Exercise: 45 minutes on treadmil 4x/ week School: [employer] sophomore  Confidentiality was discussed with the patient and if applicable, with caregiver as well. Tobacco? no Secondhand smoke exposure?no Drugs/EtOH?yes drinks 1x/ month at parties Sexually active?no Pregnancy Prevention: OCPs Safe at home, in school & in relationships? Yes  Physical Exam:  Filed Vitals:   09/21/13 1056  BP: 106/70  Height: 5' 2.8" (1.595 m)  Weight: 188 lb (85.276 kg)   BP 106/70  Ht 5' 2.8" (1.595 m)  Wt 188 lb (85.276 kg)  BMI 33.52 kg/m2  LMP 09/11/2013 Body mass index: body mass index is 33.52 kg/(m^2). 45.6% systolic and 77.3% diastolic of BP percentile by age, sex, and height. 124/80 is approximately the 95th BP  percentile reading.  Physical Examination: General appearance -obese,  alert, well appearing, and in no distress Mouth - mucous membranes moist, pharynx normal without lesions Chest - clear to auscultation, no wheezes, rales or rhonchi, symmetric air entry Heart - normal rate, regular rhythm, normal S1, S2, no murmurs, rubs, clicks or gallops Abdomen -obese,  soft, nontender, nondistended, no masses or organomegaly Extremities - peripheral pulses normal, no pedal edema, no clubbing or cyanosis Skin - normal coloration and turgor, no rashes, no suspicious skin  lesions noted    Assessment/Plan: [name] is a 18 yo F with PCOS who is still experiencing significant irregularity in her menstrual cycles despite current use of sprintec. Therefore, we will switch to Necon (1/35) to see if this provides improvement in her symptoms.   1. PCOS Discontinue Sprintec, start Necon (1/35). Continue metformin 1500mg  daily. Plan to obtain hemoglobin A1c and other screening labs and next clinic appointment.   RTC: 6 weeks  Medical decision-making:  > 30 minutes spent, more than 50% of appointment was spent discussing diagnosis and management of symptoms

## 2013-09-21 NOTE — Patient Instructions (Signed)
Polycystic Ovarian Syndrome Polycystic ovarian syndrome (PCOS) is a common hormonal disorder among women of reproductive age. Most women with PCOS grow many small cysts on their ovaries. PCOS can cause problems with your periods and make it difficult to get pregnant. It can also cause an increased risk of miscarriage with pregnancy. If left untreated, PCOS can lead to serious health problems, such as diabetes and heart disease. CAUSES The cause of PCOS is not fully understood, but genetics may be a factor. SIGNS AND SYMPTOMS   Infrequent or no menstrual periods.   Inability to get pregnant (infertility) because of not ovulating.   Increased growth of hair on the face, chest, stomach, back, thumbs, thighs, or toes.   Acne, oily skin, or dandruff.   Pelvic pain.   Weight gain or obesity, usually carrying extra weight around the waist.   Type 2 diabetes.   High cholesterol.   High blood pressure.   Female-pattern baldness or thinning hair.   Patches of thickened and dark brown or black skin on the neck, arms, breasts, or thighs.   Tiny excess flaps of skin (skin tags) in the armpits or neck area.   Excessive snoring and having breathing stop at times while asleep (sleep apnea).   Deepening of the voice.   Gestational diabetes when pregnant.  DIAGNOSIS  There is no single test to diagnose PCOS.   Your health care provider will:   Take a medical history.   Perform a pelvic exam.   Have ultrasonography done.   Check your female and female hormone levels.   Measure glucose or sugar levels in the blood.   Do other blood tests.   If you are producing too many female hormones, your health care provider will make sure it is from PCOS. At the physical exam, your health care provider will want to evaluate the areas of increased hair growth. Try to allow natural hair growth for a few days before the visit.   During a pelvic exam, the ovaries may be enlarged  or swollen because of the increased number of small cysts. This can be seen more easily by using vaginal ultrasonography or screening to examine the ovaries and lining of the uterus (endometrium) for cysts. The uterine lining may become thicker if you have not been having a regular period.  TREATMENT  Because there is no cure for PCOS, it needs to be managed to prevent problems. Treatments are based on your symptoms. Treatment is also based on whether you want to have a baby or whether you need contraception.  Treatment may include:   Progesterone hormone to start a menstrual period.   Birth control pills to make you have regular menstrual periods.   Medicines to make you ovulate, if you want to get pregnant.   Medicines to control your insulin.   Medicine to control your blood pressure.   Medicine and diet to control your high cholesterol and triglycerides in your blood.  Medicine to reduce excessive hair growth.  Surgery, making small holes in the ovary, to decrease the amount of female hormone production. This is done through a long, lighted tube (laparoscope) placed into the pelvis through a tiny incision in the lower abdomen.  HOME CARE INSTRUCTIONS  Only take over-the-counter or prescription medicine as directed by your health care provider.  Pay attention to the foods you eat and your activity levels. This can help reduce the effects of PCOS.  Keep your weight under control.  Eat foods that are   low in carbohydrate and high in fiber.  Exercise regularly. SEEK MEDICAL CARE IF:  Your symptoms do not get better with medicine.  You have new symptoms. Document Released: 10/23/2004 Document Revised: 04/19/2013 Document Reviewed: 12/15/2012 [employer] Patient Information 2014 [employer].  

## 2013-09-22 ENCOUNTER — Ambulatory Visit: Payer: BC Managed Care – PPO | Admitting: Pediatrics

## 2013-10-18 NOTE — Progress Notes (Signed)
I saw and evaluated the patient, performing the key elements of the service.  I developed the management plan that is described in the resident's note, and I agree with the content. 

## 2013-10-27 ENCOUNTER — Telehealth: Payer: Self-pay | Admitting: *Deleted

## 2013-10-27 NOTE — Telephone Encounter (Signed)
[facility] called with concern about medication. Last office visit states stop Sprintec and start Necon. Pharmacist states pt picked up Junel last. Informed pharmacist to d/c Junel and have pt start Necon.

## 2013-11-03 ENCOUNTER — Ambulatory Visit: Payer: Self-pay | Admitting: Pediatrics

## 2014-03-02 ENCOUNTER — Ambulatory Visit (INDEPENDENT_AMBULATORY_CARE_PROVIDER_SITE_OTHER): Payer: BC Managed Care – PPO | Admitting: Pediatrics

## 2014-03-02 ENCOUNTER — Encounter: Payer: Self-pay | Admitting: Pediatrics

## 2014-03-02 VITALS — BP 104/76 | Ht 63.0 in | Wt 189.4 lb

## 2014-03-02 DIAGNOSIS — E282 Polycystic ovarian syndrome: Secondary | ICD-10-CM

## 2014-03-02 DIAGNOSIS — R7303 Prediabetes: Secondary | ICD-10-CM

## 2014-03-02 DIAGNOSIS — Z113 Encounter for screening for infections with a predominantly sexual mode of transmission: Secondary | ICD-10-CM

## 2014-03-02 DIAGNOSIS — N926 Irregular menstruation, unspecified: Secondary | ICD-10-CM

## 2014-03-02 DIAGNOSIS — R7309 Other abnormal glucose: Secondary | ICD-10-CM

## 2014-03-02 DIAGNOSIS — Z6832 Body mass index (BMI) 32.0-32.9, adult: Secondary | ICD-10-CM

## 2014-03-02 LAB — CBC WITH DIFFERENTIAL/PLATELET
BASOS ABS: 0.1 10*3/uL (ref 0.0–0.1)
BASOS PCT: 1 % (ref 0–1)
EOS ABS: 0.1 10*3/uL (ref 0.0–0.7)
EOS PCT: 2 % (ref 0–5)
HCT: 38.5 % (ref 36.0–46.0)
Hemoglobin: 13.1 g/dL (ref 12.0–15.0)
Lymphocytes Relative: 41 % (ref 12–46)
Lymphs Abs: 2.5 10*3/uL (ref 0.7–4.0)
MCH: 28.1 pg (ref 26.0–34.0)
MCHC: 34 g/dL (ref 30.0–36.0)
MCV: 82.6 fL (ref 78.0–100.0)
MONO ABS: 0.5 10*3/uL (ref 0.1–1.0)
Monocytes Relative: 9 % (ref 3–12)
Neutro Abs: 2.9 10*3/uL (ref 1.7–7.7)
Neutrophils Relative %: 47 % (ref 43–77)
PLATELETS: 262 10*3/uL (ref 150–400)
RBC: 4.66 MIL/uL (ref 3.87–5.11)
RDW: 13.6 % (ref 11.5–15.5)
WBC: 6.1 10*3/uL (ref 4.0–10.5)

## 2014-03-02 MED ORDER — METFORMIN HCL ER (OSM) 500 MG PO TB24: 1500.0000 mg | ORAL_TABLET | Freq: Every day | ORAL | Status: DC

## 2014-03-02 NOTE — Patient Instructions (Signed)
Schedule an eye appt  Change your metformin to a meal time, either breakfast or dinner.  Start taking your vitamin every day.

## 2014-03-02 NOTE — Progress Notes (Signed)
Adolescent Medicine Consultation Follow-Up Visit Yesenia Joseph  is a 18 y.o. female referred by Dr. Jenne PaneBates here today for follow-up of PCOS.   PCP Confirmed?  yes  BATES,MELISA K, MD   History was provided by the patient.  Chart review:  Last seen by Dr. Marina GoodellPerry on 09/21/13.  Treatment plan at last visit included changed to Necon, continued Metformin, plan to check labs at today's visit.  PCOS Labs:   - CMP annually:  Due today - CBC annually if normal, as needed if abnormal:  Due today - Vit D once and then as needed if abnormal:  Due today - Lipid annually if abnormal, every 2 years if normal:  Due 01/2015 - Hgba1c every 3 months if abnormal, every year if normal:  Due today  Last STI screen: Due today Pertinent Labs: None Previous Pysch Screenings: None Immunizations: Per PCP  Psych Screenings completed for today's visit: None  HPI:  Pt reports she has been working out all summer, every day.  Ate pretty well, but did not lose any weight.  Very frustrating for her.  She is having diarrhea regularly.  Taking the metformin at night before bedtime.  Advised to switch to take Metformin to a meal time, either breakfast or dinner.  Periods are more regular.  Periods are somewhat heavy.  Minimal acne and no hirsutism.  Reports blurry vision, eyes get kind of glossy, intermittent, no known trigger, usually when she is standing.      Wt Readings from Last 3 Encounters:  03/02/14 189 lb 6.4 oz (85.911 kg)  09/21/13 188 lb (85.276 kg) (96%*, Z = 1.74)  06/30/13 187 lb (84.823 kg) (96%*, Z = 1.73)   * Growth percentiles are based on CDC 2-20 Years data.   Patient's last menstrual period was 02/20/2014.  ROS per HPI  The following portions of the patient's history were reviewed and updated as appropriate: allergies, current medications, past social history and problem list.  Allergies  Allergen Reactions  . Anesthetics, Amide   . Anesthetics, Ester   . Anesthetics, Halogenated      Social History:  Confidentiality was discussed with the patient and if applicable, with caregiver as well.  Tobacco? no Drugs/EtOH?no Sexually active?yes, using OCP and condoms Safe at home, in school & in relationships? Yes Safe to self? Yes Guns in the home? yes, hunting guns are kept in a gun safe outside the house  Physical Exam:  Filed Vitals:   03/02/14 1345  BP: 104/76  Height: 5\' 3"  (1.6 m)  Weight: 189 lb 6.4 oz (85.911 kg)   BP 104/76  Ht 5\' 3"  (1.6 m)  Wt 189 lb 6.4 oz (85.911 kg)  BMI 33.56 kg/m2  LMP 02/20/2014 Body mass index: body mass index is 33.56 kg/(m^2). Facility age limit for growth percentiles is 20 years.  Physical Exam  Constitutional: No distress.  Neck: No thyromegaly present.  Cardiovascular: Normal rate and regular rhythm.   No murmur heard. Pulmonary/Chest: Breath sounds normal.  Abdominal: Soft. There is no tenderness. There is no guarding.  Musculoskeletal: She exhibits no edema.  Lymphadenopathy:    She has no cervical adenopathy.    Assessment/Plan: 1. PCOS (polycystic ovarian syndrome) 2. Pre-diabetes 3. BMI 32.0-32.9,adult 4. Menstrual irregularity Continue OCP for menstrual regulation.  Continue metformin but at different time with food to reduce side effects.  Continue to work on diet and exercise changes.  Further evaluation completed today due to minimal benefit despite intensive lifestyle changes.  If  not etiology identified consider other weight loss support, such as phentermine.   - metformin (FORTAMET) 500 MG (OSM) 24 hr tablet; Take 3 tablets (1,500 mg total) by mouth daily with breakfast.  Dispense: 90 tablet; Refill: 3 - TSH - CBC with Differential - Vit D  25 hydroxy (rtn osteoporosis monitoring) - Comprehensive metabolic panel - Hemoglobin A1c - Cortisol  5. Screening for STD (sexually transmitted disease) - GC/chlamydia probe amp, urine  Follow-up:  6 weeks  Medical decision-making:  > 25 minutes spent,  more than 50% of appointment was spent discussing diagnosis and management of symptoms

## 2014-03-03 LAB — COMPREHENSIVE METABOLIC PANEL
ALK PHOS: 51 U/L (ref 39–117)
ALT: 27 U/L (ref 0–35)
AST: 27 U/L (ref 0–37)
Albumin: 4.1 g/dL (ref 3.5–5.2)
BILIRUBIN TOTAL: 0.3 mg/dL (ref 0.2–1.2)
BUN: 11 mg/dL (ref 6–23)
CO2: 26 mEq/L (ref 19–32)
CREATININE: 0.77 mg/dL (ref 0.50–1.10)
Calcium: 8.9 mg/dL (ref 8.4–10.5)
Chloride: 104 mEq/L (ref 96–112)
GLUCOSE: 88 mg/dL (ref 70–99)
Potassium: 4.4 mEq/L (ref 3.5–5.3)
Sodium: 140 mEq/L (ref 135–145)
Total Protein: 6.1 g/dL (ref 6.0–8.3)

## 2014-03-03 LAB — HEMOGLOBIN A1C
Hgb A1c MFr Bld: 5.3 % (ref <5.7)
MEAN PLASMA GLUCOSE: 105 mg/dL (ref <117)

## 2014-03-03 LAB — TSH: TSH: 1.755 u[IU]/mL (ref 0.350–4.500)

## 2014-03-03 LAB — GC/CHLAMYDIA PROBE AMP, URINE
CHLAMYDIA, SWAB/URINE, PCR: NEGATIVE
GC Probe Amp, Urine: NEGATIVE

## 2014-03-03 LAB — CORTISOL: Cortisol, Plasma: 8.5 ug/dL

## 2014-03-03 LAB — VITAMIN D 25 HYDROXY (VIT D DEFICIENCY, FRACTURES): Vit D, 25-Hydroxy: 58 ng/mL (ref 30–89)

## 2014-03-21 ENCOUNTER — Telehealth: Payer: Self-pay | Admitting: Licensed Clinical Social Worker

## 2014-03-21 NOTE — Telephone Encounter (Signed)
TC to [facility] to inform her that test results are normal & can be discussed further with Dr. Marina Goodell at next visit. Reminded her that next appt is on 9/25 at 2:30pm. Pt expressed understanding and agreement.

## 2014-04-06 ENCOUNTER — Ambulatory Visit (INDEPENDENT_AMBULATORY_CARE_PROVIDER_SITE_OTHER): Payer: BC Managed Care – PPO | Admitting: Pediatrics

## 2014-04-06 ENCOUNTER — Encounter: Payer: Self-pay | Admitting: Pediatrics

## 2014-04-06 VITALS — BP 114/68 | Ht 63.0 in | Wt 191.4 lb

## 2014-04-06 DIAGNOSIS — N926 Irregular menstruation, unspecified: Secondary | ICD-10-CM

## 2014-04-06 DIAGNOSIS — Z3202 Encounter for pregnancy test, result negative: Secondary | ICD-10-CM

## 2014-04-06 DIAGNOSIS — R7303 Prediabetes: Secondary | ICD-10-CM

## 2014-04-06 DIAGNOSIS — E282 Polycystic ovarian syndrome: Secondary | ICD-10-CM

## 2014-04-06 DIAGNOSIS — R7309 Other abnormal glucose: Secondary | ICD-10-CM

## 2014-04-06 DIAGNOSIS — Z1389 Encounter for screening for other disorder: Secondary | ICD-10-CM

## 2014-04-06 DIAGNOSIS — Z6832 Body mass index (BMI) 32.0-32.9, adult: Secondary | ICD-10-CM

## 2014-04-06 MED ORDER — NORGESTIMATE-ETH ESTRADIOL 0.25-35 MG-MCG PO TABS: 1.0000 | ORAL_TABLET | Freq: Every day | ORAL | Status: DC

## 2014-04-06 NOTE — Progress Notes (Signed)
Pre-Visit Planning  Last STI screen:  Component     Latest Ref Rng 03/02/2014  Chlamydia, Swab/Urine, PCR     NEGATIVE NEGATIVE  GC Probe Amp, Urine     NEGATIVE NEGATIVE   Pertinent Labs:  Component     Latest Ref Rng 03/02/2014  WBC     4.0 - 10.5 K/uL 6.1  RBC     3.87 - 5.11 MIL/uL 4.66  Hemoglobin     12.0 - 15.0 g/dL 13.1  HCT     36.0 - 46.0 % 38.5  MCV     78.0 - 100.0 fL 82.6  MCH     26.0 - 34.0 pg 28.1  MCHC     30.0 - 36.0 g/dL 34.0  RDW     11.5 - 15.5 % 13.6  Platelets     150 - 400 K/uL 262  Neutrophils Relative %     43 - 77 % 47  NEUT#     1.7 - 7.7 K/uL 2.9  Lymphocytes Relative     12 - 46 % 41  Lymphocytes Absolute     0.7 - 4.0 K/uL 2.5  Monocytes Relative     3 - 12 % 9  Monocytes Absolute     0.1 - 1.0 K/uL 0.5  Eosinophils Relative     0 - 5 % 2  Eosinophils Absolute     0.0 - 0.7 K/uL 0.1  Basophils Relative     0 - 1 % 1  Basophils Absolute     0.0 - 0.1 K/uL 0.1  Smear Review      Criteria for review not met  Sodium     135 - 145 mEq/L 140  Potassium     3.5 - 5.3 mEq/L 4.4  Chloride     96 - 112 mEq/L 104  CO2     19 - 32 mEq/L 26  Glucose     70 - 99 mg/dL 88  BUN     6 - 23 mg/dL 11  Creatinine     0.50 - 1.10 mg/dL 0.77  Total Bilirubin     0.2 - 1.2 mg/dL 0.3  Alkaline Phosphatase     39 - 117 U/L 51  AST     0 - 37 U/L 27  ALT     0 - 35 U/L 27  Total Protein     6.0 - 8.3 g/dL 6.1  Albumin     3.5 - 5.2 g/dL 4.1  Calcium     8.4 - 10.5 mg/dL 8.9  Hemoglobin A1C     <5.7 % 5.3  Mean Plasma Glucose     <117 mg/dL 105  TSH     0.350 - 4.500 uIU/mL 1.755  Vit D, 25-Hydroxy     30 - 89 ng/mL 58  Cortisol, Plasma      8.5   Review of previous notes:  Last seen by Dr. Henrene Pastor on 03/02/14.  Treatment plan at last visit included continue OCP for menstrual regulation, adjust timing of metformin to minimize side effects, work-up for weight gain completed to rule out other etiologies, consider weight loss  medications.   PCOS Labs & Referrals:   - CMP annually:  Due 02/2015 - CBC annually if normal, as needed if abnormal:  Due 02/2015 - Vit D once and then as needed if abnormal:  Normal 02/2014 - Lipid annually if abnormal, every 2 years if normal:  Due 02/2015 - Hgba1c every 3 months  if abnormal, every year if normal:  Due 02/2015 - Nutrition referral: Last saw nutrition 06/29/13   Previous Psych Screenings:  None Psych Screenings Due: None  Last CPE: Per PCP Immunizations Due: Recommend FLU be obtained from PCP  To Do at visit:

## 2014-04-06 NOTE — Progress Notes (Signed)
Attending Co-Signature.  I saw and evaluated the patient, performing the key elements of the service.  I developed the management plan that is described in the resident's note, and I agree with the content.  18 yo female with PCOS currently metformin and OCPs.  Started having metformin side effects.  Has decreased her dose to 1000 mg.  No other side effects.  Complains of urinary frequency and increased thirst.  UA NEG, UHCG NEG.  Menses are more regular on OCP but does sometimes skip months.  Denies missed pills.  Working on healthy eating and exercise although reports some junk food intake.  Advised to continue Metformin XR at 1000 mg once daily.  Will change OCP to Sprintec with hopes that 3rd generation progesterone will help further improve menstrual regularity.  Wt Readings from Last 3 Encounters:  04/06/14 191 lb 6.4 oz (86.818 kg)  03/02/14 189 lb 6.4 oz (85.911 kg)  09/21/13 188 lb (85.276 kg) (96%*, Z = 1.74)   * Growth percentiles are based on CDC 2-20 Years data.   Cain Sieve, MD Adolescent Medicine Specialist

## 2014-04-06 NOTE — Progress Notes (Signed)
3:03 PM  Pre-Visit Planning  Last STI screen:  Component     Latest Ref Rng 03/02/2014  Chlamydia, Swab/Urine, PCR     NEGATIVE NEGATIVE  GC Probe Amp, Urine     NEGATIVE NEGATIVE   Pertinent Labs:  Component     Latest Ref Rng 03/02/2014  WBC     4.0 - 10.5 Joseph/uL 6.1  RBC     3.87 - 5.11 MIL/uL 4.66  Hemoglobin     12.0 - 15.0 g/dL 13.1  HCT     36.0 - 46.0 % 38.5  MCV     78.0 - 100.0 fL 82.6  MCH     26.0 - 34.0 pg 28.1  MCHC     30.0 - 36.0 g/dL 34.0  RDW     11.5 - 15.5 % 13.6  Platelets     150 - 400 Joseph/uL 262  Neutrophils Relative %     43 - 77 % 47  NEUT#     1.7 - 7.7 Joseph/uL 2.9  Lymphocytes Relative     12 - 46 % 41  Lymphocytes Absolute     0.7 - 4.0 Joseph/uL 2.5  Monocytes Relative     3 - 12 % 9  Monocytes Absolute     0.1 - 1.0 Joseph/uL 0.5  Eosinophils Relative     0 - 5 % 2  Eosinophils Absolute     0.0 - 0.7 Joseph/uL 0.1  Basophils Relative     0 - 1 % 1  Basophils Absolute     0.0 - 0.1 Joseph/uL 0.1  Smear Review      Criteria for review not met  Sodium     135 - 145 mEq/L 140  Potassium     3.5 - 5.3 mEq/L 4.4  Chloride     96 - 112 mEq/L 104  CO2     19 - 32 mEq/L 26  Glucose     70 - 99 mg/dL 88  BUN     6 - 23 mg/dL 11  Creatinine     0.50 - 1.10 mg/dL 0.77  Total Bilirubin     0.2 - 1.2 mg/dL 0.3  Alkaline Phosphatase     39 - 117 U/L 51  AST     0 - 37 U/L 27  ALT     0 - 35 U/L 27  Total Protein     6.0 - 8.3 g/dL 6.1  Albumin     3.5 - 5.2 g/dL 4.1  Calcium     8.4 - 10.5 mg/dL 8.9  Hemoglobin A1C     <5.7 % 5.3  Mean Plasma Glucose     <117 mg/dL 105  TSH     0.350 - 4.500 uIU/mL 1.755  Vit D, 25-Hydroxy     30 - 89 ng/mL 58  Cortisol, Plasma      8.5   Review of previous notes:  Last seen by Dr. Henrene Pastor on 03/02/14.  Treatment plan at last visit included continue OCP for menstrual regulation, adjust timing of metformin to minimize side effects, work-up for weight gain completed to rule out other etiologies, consider  weight loss medications.   PCOS Labs & Referrals:   - CMP annually:  Due 02/2015 - CBC annually if normal, as needed if abnormal:  Due 02/2015 - Vit D once and then as needed if abnormal:  Normal 02/2014 - Lipid annually if abnormal, every 2 years if normal:  Due 02/2015 - Hgba1c  every 3 months if abnormal, every year if normal:  Due 02/2015 - Nutrition referral: Last saw nutrition 06/29/13  Previous Psych Screenings:  None Psych Screenings Due: None  Last CPE: Per PCP Immunizations Due: Recommend FLU be obtained from PCP  To Do at visit:    Adolescent Medicine Consultation Follow-Up Visit Yesenia Joseph  is a 18 y.o. female here today for follow-up of PCOS.   PCP Confirmed?  yes  Yesenia K, MD   History was provided by the patient.  Growth Chart Viewed? Yes  HPI: Yesenia Joseph is a 18 year old female who presents to clinic for follow up management of her Poly Cystic Ovarian Syndrome. At her last visit she was taking 1500 mg Metformin QHS, but was changed to QAM. She reports trying this for 3 days, but had significant GI upset. She returned to taking 1500 mg QHS, but did not have resolution of symptoms. She reports diarrhea and stomach pain so severe that she had to miss class. After that day, she decreased her medication to 1000 mg QHS and has had improvement of her symptoms, but not full resolution.   She does report polydipsia and polyuria since last visit. She has to wake at night to pee twice per week.   She reports that she has become more regular with her menses since starting the OCPs, but that she does still have irregular cycles - did not have her period last month. Denies missing pills. Is sexually active with 1 female partner, her boyfriend, who she lives with.    Patient's last menstrual period was 03/02/2014.  ROS:  10 point review of systems was completed and was negative except as noted in the HPI  The following portions of the patient's history were reviewed and updated as  appropriate: allergies, current medications, past family history, past medical history, past social history, past surgical history and problem list.  Allergies  Allergen Reactions  . Anesthetics, Amide   . Anesthetics, Ester   . 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  Patient's personal or confidential phone number:  Tobacco? no Secondhand smoke exposure?no Drugs/EtOH?no Sexually active?yes Pregnancy Prevention: OCP, reviewed condoms & plan B Safe at home, in school & in relationships? Yes Guns in the home? no Safe to self? Yes  Physical Exam:  Filed Vitals:   04/06/14 1423  BP: 114/68  Height: 5' 3"  (1.6 m)  Weight: 191 lb 6.4 oz (86.818 kg)   Wt Readings from Last 3 Encounters:  04/06/14 191 lb 6.4 oz (86.818 kg)  03/02/14 189 lb 6.4 oz (85.911 kg)  09/21/13 188 lb (85.276 kg) (96%*, Z = 1.74)   * Growth percentiles are based on CDC 2-20 Years data.   BP 114/68  Ht 5' 3"  (1.6 m)  Wt 191 lb 6.4 oz (86.818 kg)  BMI 33.91 kg/m2  LMP 03/02/2014 Body mass index: body mass index is 33.91 kg/(m^2). Facility age limit for growth percentiles is 20 years.  Physical Exam Physical Examination: General appearance - alert, well appearing, and in  no distress Mental status - alert, oriented to person, place, and time, normal mood, behavior, speech, dress, motor activity, and thought processes Eyes - pupils equal and reactive, extraocular eye movements intact Ears - bilateral TM's and external ear canals normal Lymphatics - no palpable  lymphadenopathy, no hepatosplenomegaly Chest - clear to auscultation, no wheezes, rales or rhonchi, symmetric air entry Heart - normal rate, regular rhythm, normal S1, S2, no murmurs, rubs, clicks or gallops Abdomen - soft, nontender, nondistended, no masses or organomegaly  Labs: Urine Hcg - Negative  UA dipstick:  Color - yellow  Clarity - clear  SG - 1.025 PH - 6 Leuk - neg  Nitrite - neg  Protein - neg  Glucose - neg  Ketones - Neg  Uro - neg Bili - neg  Blood - neg  Assessment/Plan:  Yesenia Joseph is a 18 year old female with PCOS and persistent symptoms and need for medication titration based on symptoms.   1. Metformin - 1000 mg QHS until GI symptoms completely resolve THEN go up to 1500 mg QHS if possible 2. Continue exercise routines  3. Continue working with Nutritional. 4. Continue OCPs   Follow-up:  3 months  Medical decision-making:  > 30 minutes spent, more than 50% of appointment was spent discussing diagnosis and management of symptoms

## 2014-05-04 ENCOUNTER — Telehealth: Payer: Self-pay | Admitting: Pediatrics

## 2014-05-04 NOTE — Telephone Encounter (Signed)
Yesenia Joseph called this morning around 8:34am. Yesenia Joseph stated that Dr. Marina GoodellPerry once prescribed her a medication for a UTI. Yesenia Joseph stated that she is experiencing the same symptoms and was wondering if Dr. Marina GoodellPerry could prescribe her another refill on that medication. Yesenia Joseph stated that she is currently in [place] in [place] and cannot come in for an appointment. Yesenia Joseph stated that while she is at school she uses the [facility] [address]. The pharmacy's exact address is [address] [place], [place] [id number]. Yesenia Joseph would like Sandy or Dr. Marina GoodellPerry to give her a call back as soon as possible. Yesenia Joseph can be reached at [phone].

## 2014-05-04 NOTE — Telephone Encounter (Signed)
Returned call to patient and advised that we weren't the ones who had prescribed UTI rx in the past-- likely PCP. She agreed to call PCP to follow up with her concerns.

## 2014-06-26 ENCOUNTER — Encounter: Payer: Self-pay | Admitting: Pediatrics

## 2014-06-26 ENCOUNTER — Ambulatory Visit (INDEPENDENT_AMBULATORY_CARE_PROVIDER_SITE_OTHER): Payer: BC Managed Care – PPO | Admitting: Pediatrics

## 2014-06-26 VITALS — BP 104/68 | Ht 62.52 in | Wt 190.6 lb

## 2014-06-26 DIAGNOSIS — Z6832 Body mass index (BMI) 32.0-32.9, adult: Secondary | ICD-10-CM

## 2014-06-26 DIAGNOSIS — E282 Polycystic ovarian syndrome: Secondary | ICD-10-CM

## 2014-06-26 DIAGNOSIS — Z0000 Encounter for general adult medical examination without abnormal findings: Secondary | ICD-10-CM

## 2014-06-26 MED ORDER — METFORMIN HCL ER (OSM) 500 MG PO TB24: 1500.0000 mg | ORAL_TABLET | Freq: Every day | ORAL | Status: DC

## 2014-06-26 MED ORDER — NORGESTIMATE-ETH ESTRADIOL 0.25-35 MG-MCG PO TABS: 1.0000 | ORAL_TABLET | Freq: Every day | ORAL | Status: DC

## 2014-06-26 NOTE — Patient Instructions (Signed)
Congratulations on your new dog!  Keep up all the good work.  Increase your metformin to 1500 mg once daily (3 tablets).  If you have stomach issues with it, then try taking 1000 mg (2 tablets) with breakfast and 500 mg (1 tablet) with dinner.  Call anytime if you have any concerns or questions.

## 2014-06-26 NOTE — Progress Notes (Signed)
3:18 PM  Adolescent Medicine Consultation Follow-Up Visit Yesenia Joseph  is a 18 y.o. female referred by Dr. Jenne PaneBates here today for follow-up of PCOS.   PCP Confirmed?  yes  BATES,MELISA K, MD   History was provided by the patient.  Previous Psych Screenings:  None Psych Screenings Due: None  Review of previous notes:  Last seen in Adolescent Medicine Clinic on 04/06/14.  Treatment plan at last visit included continue metformin 1000 mg QHS until symptoms resolve then increase to 1500 if tolerated.  Continue exercise and healthy eating.  Continue OCPs.   Last CPE: Per PCP  Last STI screen:  Component     Latest Ref Rng 03/02/2014  Chlamydia, Swab/Urine, PCR     NEGATIVE NEGATIVE  GC Probe Amp, Urine     NEGATIVE NEGATIVE   Pertinent Labs: None  Immunizations Due: Per PCP  Wt Readings from Last 3 Encounters:  06/26/14 190 lb 9.6 oz (86.456 kg)  04/06/14 191 lb 6.4 oz (86.818 kg)  03/02/14 189 lb 6.4 oz (85.911 kg)    Growth Chart Viewed? not applicable  HPI:  Pt reports things are overall going really well. Take 2 tablets of metformin in morning, no GI side effects Takes it with her vyvanse and it is easy to remember.  On OCP, periods are regular.  Missed 3 weeks of OCP and was reminded of how much it was helping.  Now back on it.  Works great when she is on it.  Helps regulate, minimize bleeding and decrease cramping.  Has been doing really well with her running and exercise routine.  Got a dog and needs to take her out regularly.  Feels healthier now.  Has lost a little weight but really wants to work on keeping up her healthy lifestyle regardless of her weight.  Patient's last menstrual period was 06/05/2014.   Allergies  Allergen Reactions  . Anesthetics, Amide   . Anesthetics, Ester   . Anesthetics, Halogenated     Social History: Confidentiality was discussed with the patient and if applicable, with caregiver as well.  Tobacco? no Secondhand smoke  exposure?no Drugs/EtOH?no Sexually active?yes Pregnancy Prevention: OCP, reviewed condoms & plan B  Physical Exam:  Filed Vitals:   06/26/14 1510  BP: 104/68  Height: 5' 2.52" (1.588 m)  Weight: 190 lb 9.6 oz (86.456 kg)   BP 104/68 mmHg  Ht 5' 2.52" (1.588 m)  Wt 190 lb 9.6 oz (86.456 kg)  BMI 34.28 kg/m2  LMP 06/05/2014 Body mass index: body mass index is 34.28 kg/(m^2). Facility age limit for growth percentiles is 20 years.  Physical Exam  Constitutional: No distress.  Neck: No thyromegaly present.  Cardiovascular: Normal rate and regular rhythm.   No murmur heard. Pulmonary/Chest: Breath sounds normal.  Abdominal: Soft. There is no tenderness. There is no guarding.  Musculoskeletal: She exhibits no edema.  Lymphadenopathy:    She has no cervical adenopathy.  Nursing note and vitals reviewed.   Assessment/Plan: 1. PCOS (polycystic ovarian syndrome) 2. BMI 32.0-32.9,adult Reinforced positive lifestyle changes.  Advised to increase her metformin dose to 1500 mg given still minimal weight loss in context of significant lifestyle changes. - metformin (FORTAMET) 500 MG (OSM) 24 hr tablet; Take 3 tablets (1,500 mg total) by mouth daily with breakfast.  Dispense: 90 tablet; Refill: 3 - norgestimate-ethinyl estradiol (SPRINTEC 28) 0.25-35 MG-MCG tablet; Take 1 tablet by mouth daily.  Dispense: 1 Package; Refill: 11  PCOS Labs & Referrals:  - CMP annually: Due  02/2015 - CBC annually if normal, as needed if abnormal: Due 02/2015 - Vit D once and then as needed if abnormal: Normal 02/2014 - Lipid annually if abnormal, every 2 years if normal: Due 02/2015 - Hgba1c every 3 months if abnormal, every year if normal: Due 02/2015 - Nutrition referral: Last saw nutrition 06/29/13  Follow-up:  6 months or sooner if any issues  Medical decision-making:  > 15 minutes spent, more than 50% of appointment was spent discussing diagnosis and management of symptoms

## 2014-07-02 ENCOUNTER — Ambulatory Visit: Payer: Self-pay | Admitting: Pediatrics

## 2014-07-03 ENCOUNTER — Ambulatory Visit: Payer: Self-pay | Admitting: Pediatrics

## 2014-11-14 ENCOUNTER — Telehealth: Payer: Self-pay | Admitting: Licensed Clinical Social Worker

## 2014-11-14 ENCOUNTER — Telehealth: Payer: Self-pay | Admitting: *Deleted

## 2014-11-14 DIAGNOSIS — E282 Polycystic ovarian syndrome: Secondary | ICD-10-CM

## 2014-11-14 NOTE — Telephone Encounter (Signed)
I can make the referral.  She does not really have a PCP.

## 2014-11-14 NOTE — Telephone Encounter (Signed)
Received VM from patient asking to schedule an appointment (sooner than her current one in June) with Dr. Marina GoodellPerry. Patient also asked about a referral to nutrition.  Called patient back and left VM to call to schedule with either Dr. Marina GoodellPerry or Alfonso Ramusaroline Hacker, NP. Also informed patient that referrals usually have to go through the PCP. Advised pt to ask PCP and, if any issues, call back here.

## 2014-11-14 NOTE — Telephone Encounter (Signed)
Yesenia Joseph called in this afternoon asking if she could be referred to a nutritionist again since she was previously by Dr. Marina Joseph. She states that Dr. Marina Joseph is really the only doctor she currently sees. Please call her back [phone]

## 2014-11-14 NOTE — Telephone Encounter (Signed)
Doesn't this have to be done by PCP?

## 2014-12-11 ENCOUNTER — Other Ambulatory Visit: Payer: Self-pay | Admitting: Pediatrics

## 2014-12-11 ENCOUNTER — Ambulatory Visit (INDEPENDENT_AMBULATORY_CARE_PROVIDER_SITE_OTHER): Payer: BLUE CROSS/BLUE SHIELD | Admitting: Pediatrics

## 2014-12-11 ENCOUNTER — Encounter: Payer: Self-pay | Admitting: Pediatrics

## 2014-12-11 ENCOUNTER — Inpatient Hospital Stay (HOSPITAL_COMMUNITY): Admission: RE | Admit: 2014-12-11 | Payer: Self-pay | Source: Ambulatory Visit

## 2014-12-11 ENCOUNTER — Encounter (INDEPENDENT_AMBULATORY_CARE_PROVIDER_SITE_OTHER): Payer: Self-pay

## 2014-12-11 ENCOUNTER — Ambulatory Visit (INDEPENDENT_AMBULATORY_CARE_PROVIDER_SITE_OTHER): Payer: BLUE CROSS/BLUE SHIELD | Admitting: Clinical

## 2014-12-11 VITALS — BP 130/78 | HR 90 | Ht 62.75 in | Wt 187.0 lb

## 2014-12-11 DIAGNOSIS — E282 Polycystic ovarian syndrome: Secondary | ICD-10-CM

## 2014-12-11 DIAGNOSIS — F4322 Adjustment disorder with anxiety: Secondary | ICD-10-CM | POA: Diagnosis not present

## 2014-12-11 DIAGNOSIS — Z113 Encounter for screening for infections with a predominantly sexual mode of transmission: Secondary | ICD-10-CM

## 2014-12-11 DIAGNOSIS — R7309 Other abnormal glucose: Secondary | ICD-10-CM

## 2014-12-11 DIAGNOSIS — Z6832 Body mass index (BMI) 32.0-32.9, adult: Secondary | ICD-10-CM

## 2014-12-11 DIAGNOSIS — N926 Irregular menstruation, unspecified: Secondary | ICD-10-CM | POA: Diagnosis not present

## 2014-12-11 DIAGNOSIS — R7303 Prediabetes: Secondary | ICD-10-CM

## 2014-12-11 DIAGNOSIS — Z124 Encounter for screening for malignant neoplasm of cervix: Secondary | ICD-10-CM

## 2014-12-11 MED ORDER — METFORMIN HCL ER 500 MG PO TB24
1500.0000 mg | ORAL_TABLET | Freq: Every day | ORAL | Status: DC
Start: 2014-12-11 — End: 2015-02-21

## 2014-12-11 NOTE — Progress Notes (Signed)
Adolescent Medicine Follow Up Visit Yesenia Joseph  is a 19 y.o. female referred by Santa GeneraBates, Melisa, MD here today for follow up of PCOS.      Growth Chart Viewed? yes   History was provided by the patient.   HPI:  Yesenia Joseph is a 19 year old with a history of PCOS and ADHD who presents to the adolescent medicine clinic for follow up.   At her last visit, her metformin was increased to 1500 mg daily from 1000 mg daily. However, she has been taking Metformin 1000 mg daily; tried to increase to three pills per day (1500 mg daily) and was having diarrhea and stomach pain so went back to down to two pills. She resumed 1000 mg daily about two weeks after last visit went down to two pills. Tried to go back up to three but continued to have to use the bathroom and have GI upset and couldn't tolerate 1500 mg daily.   She has been taking her OCP nightly. Her most recent menstrual cycle started last Friday (four days ago). Her cycles remain irregular and she cannot recall her last one. Pain is improved overall but cycles remain irregular. Cycles usually last about 3 days and bleeding is lighter. No acne. No hair growth.   In terms of her diet, she has cut out soda and limiting snacking. She has been doing weight lifting in addition to running and walking. Overall, she has lost eleven pounds from when she started making lifestyle changes. Sometimes she is exercising twice a day, walking in the morning and weight lifting in the afternoon.   She is sexually active with her boyfriend who also attends [employer]. She reports that sexual activity is painful. She says she has tried different positions and every position is painful. She says the pain is both inside and outside. She thinks that anxiety may contribute to the discomfort as she feels anxious and has been feeling more anxious this semester.   Patient's last menstrual period was 12/07/2014 (lmp unknown).  Review of Systems  Constitutional: Positive for weight  loss. Negative for fever.  HENT: Negative for congestion.   Eyes: Negative for redness.  Respiratory: Negative for cough.   Cardiovascular: Negative for palpitations.  Gastrointestinal: Negative for vomiting and constipation.  Genitourinary: Negative for dysuria.  Musculoskeletal: Negative for myalgias.  Skin: Negative for rash.  Neurological: Negative for dizziness, seizures and headaches.  Psychiatric/Behavioral: The patient is nervous/anxious.      Allergies  Allergen Reactions  . Anesthetics, Amide   . Anesthetics, Ester   . Anesthetics, Halogenated     Past Medical History:  Reviewed and updated?  yes Past Medical History  Diagnosis Date  . Hyperthyroidism   . Pre-diabetes   . PCOS (polycystic ovarian syndrome)     Family History: Reviewed and updated? yes Family History  Problem Relation Age of Onset  . Diabetes Other     Social History: Lives with:  mother, father, sister and brother Parental relations:  good Siblings:  brothers: 2, sisters: 2 Friends/Peers:  has many healthy friendships School:  attends [employer], will be a senior this fall Future Plans:  college Nutrition/Eating Behaviors:  The patient eats a regular, healthy diet. The patient has lost three pounds over the past five months due to diet and exercise. Exercise:  running, walking, and weight lifting Sports:  running and walking  Screen Time:  minimal Sleep:  no sleep issues  Confidentiality was discussed with the patient and if  applicable, with caregiver as well.  Tobacco?  no Secondhand smoke exposure?  no Drugs/ETOH?  no Partner preference?  female Sexually Active?  yes   Pregnancy Prevention:  condoms and birth control pills, reviewed condoms & plan B Safe at home, in school & in relationships?  Yes Safe to self?  Yes    The following portions of the patient's history were reviewed and updated as appropriate: allergies, current medications, past family history, past medical history,  past social history, past surgical history and problem list.  Physical Exam:  Filed Vitals:   12/11/14 1332  BP: 130/78  Pulse: 90  Height: 5' 2.75" (1.594 m)  Weight: 187 lb (84.823 kg)   BP 130/78 mmHg  Pulse 90  Ht 5' 2.75" (1.594 m)  Wt 187 lb (84.823 kg)  BMI 33.38 kg/m2  LMP 12/07/2014 (LMP Unknown) Body mass index: body mass index is 33.38 kg/(m^2). Facility age limit for growth percentiles is 20 years.  Physical Exam  Constitutional: She is oriented to person, place, and time. She appears well-developed and well-nourished. No distress.  HENT:  Head: Normocephalic.  Right Ear: External ear normal.  Left Ear: External ear normal.  Mouth/Throat: Oropharynx is clear and moist.  Eyes: Conjunctivae are normal. Pupils are equal, round, and reactive to light.  Neck: Normal range of motion. No thyromegaly present.  Cardiovascular: Normal rate and regular rhythm.   Pulmonary/Chest: Effort normal and breath sounds normal. No respiratory distress.  Abdominal: Soft. Bowel sounds are normal. She exhibits no distension. There is no tenderness.  Musculoskeletal: Normal range of motion.  Neurological: She is alert and oriented to person, place, and time.  Skin: Skin is warm and dry.     Assessment/Plan: Kadeshia is a 19 yo with a history of PCOS and ADHD who presents for PCOS follow up. Pain and bleeding overall improved with use of OCP but continues to have irregular cycles despite using OCP. Will obtain labs today to further evaluate and advised continuing use of OCP. Endorses pain with sexual activity; did pelvic exam today which revealed normal anatomy and no abnormalities. Also endorsed anxiety and seen by behavioral health clinician in clinic and was given exercises to help with anxiety. Pap smear done given age for routine HCM. Will have her return in about two months prior to school for follow up of the issues above.   1. PCOS: -Will obtain labs including CMP, Hemoglobin A1C,  Lipid panel, CBC with differential, DHEA, FSH, LH, Prolactin, testosterone, TSH, T4, and Vit D - Continue OCP - Metformin 1000 mg daily  - Continued to encourage regular exercise and healthy eating  2. Dyspareunia: - Pelvic exam reassuring, no abnormalities appreciated on Dr. Lamar Sprinkles exam - Seen by behavioral health clinician for relaxation exercises given patient concerned that anxiety may be contributing to discomfort  3. Health maintenance: - Pap smear done in clinic today 11/2014, labs sent as indicated above  - GC/Chlamydia sent    Follow-up:   Return in about 9 weeks (around 02/12/2015) for PCOS, with either Dr. Marina Goodell or Rayfield Citizen.   PCOS Labs & Referrals:  - CMP annually: 11/2014 - CBC annually if normal, as needed if abnormal: 11/2014 - Vit D once and then as needed if abnormal: 11/2014 - Lipid annually if abnormal, every 2 years if normal: 11/2014 - Hgba1c every 3 months if abnormal, every year if normal: 11/2014

## 2014-12-11 NOTE — Patient Instructions (Addendum)
Try to use the various relaxation and other strategies that [facility] reviewed with you today. Try using lubricant, such as astroglide, during sexual activity  We will contact with today's results via mychart.  Schedule an eye appointment.

## 2014-12-11 NOTE — Progress Notes (Signed)
Pre-Visit Planning  Review of previous notes:  Yesenia Joseph  is a 19 y.o. female referred by Fredderick SeveranceBATES,MELISA K, MD.   [facility]2014 for PCOS.  Treatment plan at last visit included continue with positive lifestule changed, cont metformin and OCP.Marland Kitchen.   Previous Psych Screenings?  no Psych Screenings Due? no  STI screen in the past year? yes Pertinent Labs? no  Clinical Staff Visit Tasks:   - Urine GC/CT  Provider Visit Tasks: - Assess PCOS symptoms - Assess medication benefits and side effects - Review healthy lifestyle changes  PCOS Labs & Referrals:  - CMP annually: Due 02/2015 - CBC annually if normal, as needed if abnormal: Due 02/2015 - Vit D once and then as needed if abnormal: Normal 02/2014 - Lipid annually if abnormal, every 2 years if normal: Due 02/2015 - Hgba1c every 3 months if abnormal, every year if normal: Due 02/2015 - Nutrition referral: Last saw nutrition 06/29/13

## 2014-12-11 NOTE — Progress Notes (Signed)
Attending Co-Signature.  I saw and evaluated the patient, performing the key elements of the service.  I developed the management plan that is described in the resident's note, and I agree with the content.  Pelvic/PAP completed:  Normal external genitalia.  Nontender exam.  Normal vaginal mucosa.  No vaginal or cervical discharge.  No CMT.  Adnexa nontender, no masses.  Uterus nontender, no masses.   Cain SievePERRY, Zane Pellecchia FAIRBANKS, MD Adolescent Medicine Specialist

## 2014-12-12 ENCOUNTER — Ambulatory Visit: Payer: BLUE CROSS/BLUE SHIELD | Admitting: Dietician

## 2014-12-12 LAB — CBC WITH DIFFERENTIAL/PLATELET
BASOS ABS: 0 10*3/uL (ref 0.0–0.1)
BASOS PCT: 0 % (ref 0–1)
EOS ABS: 0.1 10*3/uL (ref 0.0–0.7)
Eosinophils Relative: 1 % (ref 0–5)
HEMATOCRIT: 40.4 % (ref 36.0–46.0)
Hemoglobin: 13.3 g/dL (ref 12.0–15.0)
LYMPHS ABS: 1.8 10*3/uL (ref 0.7–4.0)
Lymphocytes Relative: 25 % (ref 12–46)
MCH: 27.8 pg (ref 26.0–34.0)
MCHC: 32.9 g/dL (ref 30.0–36.0)
MCV: 84.5 fL (ref 78.0–100.0)
MONO ABS: 0.5 10*3/uL (ref 0.1–1.0)
MPV: 10.4 fL (ref 8.6–12.4)
Monocytes Relative: 7 % (ref 3–12)
Neutro Abs: 4.9 10*3/uL (ref 1.7–7.7)
Neutrophils Relative %: 67 % (ref 43–77)
PLATELETS: 219 10*3/uL (ref 150–400)
RBC: 4.78 MIL/uL (ref 3.87–5.11)
RDW: 13.8 % (ref 11.5–15.5)
WBC: 7.3 10*3/uL (ref 4.0–10.5)

## 2014-12-12 LAB — LUTEINIZING HORMONE: LH: 10.7 m[IU]/mL

## 2014-12-12 LAB — DHEA-SULFATE: DHEA-SO4: 222 ug/dL (ref 51–321)

## 2014-12-12 LAB — CERVICOVAGINAL ANCILLARY ONLY
Chlamydia: NEGATIVE
NEISSERIA GONORRHEA: NEGATIVE

## 2014-12-12 LAB — TESTOSTERONE, FREE, TOTAL, SHBG
Sex Hormone Binding: 87 nmol/L (ref 17–124)
TESTOSTERONE: 17 ng/dL (ref 10–70)
Testosterone, Free: 1.5 pg/mL (ref 0.6–6.8)
Testosterone-% Free: 0.9 % (ref 0.4–2.4)

## 2014-12-12 LAB — TSH: TSH: 1.438 u[IU]/mL (ref 0.350–4.500)

## 2014-12-12 LAB — FOLLICLE STIMULATING HORMONE: FSH: 7.6 m[IU]/mL

## 2014-12-12 LAB — GC/CHLAMYDIA PROBE AMP, URINE
CHLAMYDIA, SWAB/URINE, PCR: NEGATIVE
GC PROBE AMP, URINE: NEGATIVE

## 2014-12-12 LAB — HEMOGLOBIN A1C
HEMOGLOBIN A1C: 5.1 % (ref <5.7)
Mean Plasma Glucose: 100 mg/dL (ref <117)

## 2014-12-12 LAB — PROLACTIN: Prolactin: 7.4 ng/mL

## 2014-12-13 ENCOUNTER — Encounter: Payer: Self-pay | Admitting: Pediatrics

## 2014-12-13 LAB — CYTOLOGY - PAP

## 2014-12-14 LAB — VITAMIN D 25 HYDROXY (VIT D DEFICIENCY, FRACTURES): VIT D 25 HYDROXY: 32 ng/mL (ref 30–100)

## 2014-12-14 LAB — LIPID PANEL
Cholesterol: 147 mg/dL (ref 0–200)
HDL: 49 mg/dL (ref >=46)
LDL CALC: 73 mg/dL (ref 0–99)
Total CHOL/HDL Ratio: 3 Ratio
Triglycerides: 125 mg/dL (ref <150)
VLDL: 25 mg/dL (ref 0–40)

## 2014-12-14 LAB — COMPREHENSIVE METABOLIC PANEL
ALT: 19 U/L (ref 0–35)
AST: 22 U/L (ref 0–37)
Albumin: 4 g/dL (ref 3.5–5.2)
Alkaline Phosphatase: 51 U/L (ref 39–117)
BUN: 12 mg/dL (ref 6–23)
CALCIUM: 8.4 mg/dL (ref 8.4–10.5)
CO2: 26 mEq/L (ref 19–32)
CREATININE: 0.9 mg/dL (ref 0.50–1.10)
Chloride: 103 mEq/L (ref 96–112)
Glucose, Bld: 64 mg/dL — ABNORMAL LOW (ref 70–99)
Potassium: 3.9 mEq/L (ref 3.5–5.3)
SODIUM: 138 meq/L (ref 135–145)
TOTAL PROTEIN: 6 g/dL (ref 6.0–8.3)
Total Bilirubin: 0.5 mg/dL (ref 0.2–1.2)

## 2014-12-14 LAB — T4, FREE: Free T4: 0.92 ng/dL (ref 0.80–1.80)

## 2014-12-16 NOTE — BH Specialist Note (Signed)
Primary Care Provider: Fredderick SeveranceBATES,MELISA K, MD  Referring Provider: Delorse LekPERRY, MARTHA, MD & Hayden RasmussenANNON, LAURA, MD Session Time:  1430 - 1500 (30 minutes) Type of Service: Behavioral Health - Individual/Family Interpreter: No.  Interpreter Name & Language: N/A   PRESENTING CONCERNS:  Anise SalvoKayla D Barker is a 19 y.o. female brought in by patient. Anise SalvoKayla D Barker [facility] to decrease anxiety symptoms   GOALS ADDRESSED:  Increase ability to relax to decrease anxiety    INTERVENTIONS:  Built rapport Provided psycho education on stress, anxiety & relaxation skills, also using apps Provided information & practiced relaxation exercises with her   ASSESSMENT/OUTCOME:  Dorathy DaftKayla was open to relaxation techniques.  She actively participated in the different exercises including deep breathing, visualization, and progressive muscle relaxation.  Dorathy DaftKayla reported that it was helpful and felt more relaxed after doing the exercises.   PLAN:  Dorathy DaftKayla will practice one relaxation technique each day.  Scheduled next visit: Follow up scheduled with Dr. Marina GoodellPerry in August.  [facility] will be available for additional support, information or resources as needed. No [facility] follow up scheduled at this time.  Remmi Armenteros P Bettey CostaWilliams LCSW Behavioral Health Clinician [facility]

## 2014-12-21 ENCOUNTER — Ambulatory Visit: Payer: BLUE CROSS/BLUE SHIELD | Admitting: *Deleted

## 2014-12-21 ENCOUNTER — Ambulatory Visit: Payer: Self-pay | Admitting: Pediatrics
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
## 2015-01-31 ENCOUNTER — Encounter: Payer: Self-pay | Admitting: *Deleted

## 2015-01-31 ENCOUNTER — Encounter: Payer: BLUE CROSS/BLUE SHIELD | Attending: Pediatrics | Admitting: *Deleted

## 2015-01-31 DIAGNOSIS — E282 Polycystic ovarian syndrome: Secondary | ICD-10-CM | POA: Diagnosis present

## 2015-01-31 DIAGNOSIS — Z713 Dietary counseling and surveillance: Secondary | ICD-10-CM | POA: Diagnosis not present

## 2015-01-31 NOTE — Patient Instructions (Addendum)
We want things with fiber!!!!  Whole grain!!!!   Breakfast: Wheat cereal with a protein (strogn cheese egg or handful nuts or spoonful of peanut butter) Peanut butter on whole wheat bread or english muffin with pice of fruit Eggs with [place] sausage  Lunch:  sandwich on wheat with yogurt or fruit or veggies or side salad) Soup with piece of leftovers Leftovers Salad with protein on it with wheat crackers Chicken salad with crackers and fruit  Snack: Fruit with nuts or string cheese Trail mix [employer] protein bar Yogurt Veggies with hummus Cheese and wheat crackers Blue corn chips with salsa  Dinner: Chicken breast with salsa in crockpot for a couple hours Potatoes, brown rice, quinoa Consider pork chops or pork tenderloin Try ground [place] meat for tacos or spaghetti Try [place] sausage or [place] bacon  Beverages: water, milk with chocolate added, juice sometimes  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

## 2015-01-31 NOTE — Progress Notes (Signed)
  Medical Nutrition Therapy:  Appt start time: 1100 end time:  1200.   Assessment:  Primary concerns today: Yesenia Joseph is here for nutrition counseling pertaining to diagnosis of PCOS and desire for lifestyle modification.  She was seen by this provider upon diagnosis 2 years ago.  From mid April has lost ~20 lb.   She is a picky eater and it's hard for her to eats healthier foods because she does'tn like them.  She exercises 5 days/week and wasn't seeing results.  She started a [employer] as school and lost 10 pounds in 2 weeks.  She has cut back on her snacking.  SHe has been workign out at home and she is worried that when she starts back at school she won't have as much as time to work out.  She wants to make sure she can sustain her eating and make good choices.  She feels like she eatsin out of stress or boredome and wants to maintain her better snack habits.   Will have class 3 days/week from 9-3:30.  Plans to do boot camp again.  Wants to use her crockpot   Preferred Learning Style:   No preference indicated   Learning Readiness: *  Change in progress   MEDICATIONS: see list   DIETARY INTAKE:  Usual eating pattern includes 2-3 meals and couple snacks per day. Likes to eat: chicken, zucchini, carrots, cucumbers, salads, peanut Doesn't like: seafood, spicy food, breakfast foods   24-hr recall:  B ( AM): Nabs  Snk ( AM): not usually  L ( PM): PB sandwich Snk ( PM): almonds D ( PM): chicken with vegetble Snk ( PM): not anymore Beverages: water, chocolate milk, juice, Sunny Delight  Usual physical activity: 5 days/week [employer], walks dog daily  Estimated energy needs: 2400 calories 270 g carbohydrates 180 g protein 67 g fat    Nutritional Diagnosis:  NB-1.1 Food and nutrition-related knowledge deficit As related to balanced meal planning.  As evidenced by patient self-report.    Intervention:  Nutrition counseling provided. Goals: We want things with fiber!!!!  Whole  grain!!!!   Breakfast: Wheat cereal with a protein (strogn cheese egg or handful nuts or spoonful of peanut butter) Peanut butter on whole wheat bread or english muffin with pice of fruit Eggs with [place] sausage  Lunch:  sandwich on wheat with yogurt or fruit or veggies or side salad) Soup with piece of leftovers Leftovers Salad with protein on it with wheat crackers Chicken salad with crackers and fruit  Snack: Fruit with nuts or string cheese Trail mix [employer] protein bar Yogurt Veggies with hummus Cheese and wheat crackers Blue corn chips with salsa  Dinner: Chicken breast with salsa in crockpot for a couple hours Potatoes, brown rice, quinoa Consider pork chops or pork tenderloin Try ground [place] meat for tacos or spaghetti Try [place] sausage or [place] bacon  Beverages: water, milk with chocolate added, juice sometimes  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

## 2015-02-12 ENCOUNTER — Ambulatory Visit: Payer: Self-pay | Admitting: Pediatrics

## 2015-02-20 ENCOUNTER — Encounter: Payer: Self-pay | Admitting: Pediatrics

## 2015-02-20 NOTE — Progress Notes (Signed)
Pre-Visit Planning  Yesenia Joseph  is a 18 y.o. female referred by Sydell Axon, MD.   Last seen in [facility] on 12/11/2014 for PCOS, Pap smear, dyspareunia and anxiety.   Previous Psych Screenings?  no  Treatment plan at last visit included obtain labs, continue metformin, continue healthy eating, met with [facility].   Clinical Staff Visit Tasks:   - Urine GC/CT due? Results not entered in system but received results and they were negative - Psych Screenings Due? yes, PHQSADs  Provider Visit Tasks: - Assess PCOS symptoms - Review current treatment in place, review nutrition visit - Assess dyspareunia - Assess anxiety - Pertinent Labs? yes,  Component     Latest Ref Rng 12/11/2014  WBC     4.0 - 10.5 K/uL 7.3  RBC     3.87 - 5.11 MIL/uL 4.78  Hemoglobin     12.0 - 15.0 g/dL 13.3  HCT     36.0 - 46.0 % 40.4  MCV     78.0 - 100.0 fL 84.5  MCH     26.0 - 34.0 pg 27.8  MCHC     30.0 - 36.0 g/dL 32.9  RDW     11.5 - 15.5 % 13.8  Platelets     150 - 400 K/uL 219  MPV     8.6 - 12.4 fL 10.4  Neutrophils     43 - 77 % 67  NEUT#     1.7 - 7.7 K/uL 4.9  Lymphocytes     12 - 46 % 25  Lymphocyte #     0.7 - 4.0 K/uL 1.8  Monocytes Relative     3 - 12 % 7  Monocyte #     0.1 - 1.0 K/uL 0.5  Eosinophil     0 - 5 % 1  Eosinophils Absolute     0.0 - 0.7 K/uL 0.1  Basophil     0 - 1 % 0  Basophils Absolute     0.0 - 0.1 K/uL 0.0  Smear Review      Criteria for review not met  Sodium     135 - 145 mEq/L 138  Potassium     3.5 - 5.3 mEq/L 3.9  Chloride     96 - 112 mEq/L 103  CO2     19 - 32 mEq/L 26  Glucose     70 - 99 mg/dL 64 (L)  BUN     6 - 23 mg/dL 12  Creatinine     0.50 - 1.10 mg/dL 0.90  Total Bilirubin     0.2 - 1.2 mg/dL 0.5  Alkaline Phosphatase     39 - 117 U/L 51  AST     0 - 37 U/L 22  ALT     0 - 35 U/L 19  Total Protein     6.0 - 8.3 g/dL 6.0  Albumin     3.5 - 5.2 g/dL 4.0  Calcium     8.4 - 10.5 mg/dL 8.4   Cholesterol     0 - 200 mg/dL 147  Triglycerides     <150 mg/dL 125  HDL Cholesterol     >=46 mg/dL 49  Total CHOL/HDL Ratio      3.0  VLDL     0 - 40 mg/dL 25  LDL (calc)     0 - 99 mg/dL 73  Testosterone     10 - 70 ng/dL 17  Sex Hormone Binding  17 - 124 nmol/L 87  Testosterone Free     0.6 - 6.8 pg/mL 1.5  Testosterone-% Free     0.4 - 2.4 % 0.9  Chlamydia, Swab/Urine, PCR     NEGATIVE NEGATIVE  GC Probe Amp, Urine     NEGATIVE NEGATIVE  Chlamydia      Negative  Neisseria gonorrhea      Negative  Hemoglobin A1C     <5.7 % 5.1  Mean Plasma Glucose     <117 mg/dL 100  FSH      7.6  Prolactin      7.4  LH      10.7  TSH     0.350 - 4.500 uIU/mL 1.438  DHEA-SO4     51 - 321 ug/dL 222  CYTOLOGY - PAP      PAP RESULT - NORMAL  Free T4     0.80 - 1.80 ng/dL 0.92  Vit D, 25-Hydroxy     30 - 100 ng/mL 32   PCOS Labs & Referrals:   - Hgba1c annually if normal, every 3 months if abnormal:  Due 11/2015 - CMP annually if normal, as needed if abnormal:  Due 11/2015 - CBC annually if normal, as needed if abnormal:  Due 11/2015 - Lipid every 2 years if normal, annually if abnormal:  Due 11/2016 - Vitamin D annually if normal, as needed if abnormal: Due 11/2015 - Nutrition referral: Visit with Ozzie Hoyle 01/31/2015 - BH Screening: DUE NOW

## 2015-02-21 ENCOUNTER — Encounter: Payer: Self-pay | Admitting: Pediatrics

## 2015-02-21 ENCOUNTER — Ambulatory Visit (INDEPENDENT_AMBULATORY_CARE_PROVIDER_SITE_OTHER): Payer: BLUE CROSS/BLUE SHIELD | Admitting: Pediatrics

## 2015-02-21 VITALS — BP 100/69 | HR 83 | Ht 62.0 in | Wt 176.0 lb

## 2015-02-21 DIAGNOSIS — Z6832 Body mass index (BMI) 32.0-32.9, adult: Secondary | ICD-10-CM | POA: Diagnosis not present

## 2015-02-21 DIAGNOSIS — E282 Polycystic ovarian syndrome: Secondary | ICD-10-CM

## 2015-02-21 DIAGNOSIS — F411 Generalized anxiety disorder: Secondary | ICD-10-CM

## 2015-02-21 MED ORDER — METFORMIN HCL ER 500 MG PO TB24: 1000.0000 mg | ORAL_TABLET | Freq: Every day | ORAL | Status: DC

## 2015-02-21 NOTE — Progress Notes (Signed)
Pre-Visit Planning  Yesenia Joseph  is a 19 y.o. female referred by Sydell Axon, MD.   Last seen in [facility] on 12/11/2014 for PCOS, Pap smear, dyspareunia and anxiety.   Previous Psych Screenings?  no  Treatment plan at last visit included obtain labs, continue metformin, continue healthy eating, met with [facility].   Clinical Staff Visit Tasks:   - Urine GC/CT due? Results not entered in system but received results and they were negative - Psych Screenings Due? yes, PHQSADs  Provider Visit Tasks: - Assess PCOS symptoms - Review current treatment in place, review nutrition visit - Assess dyspareunia - Assess anxiety  - Pertinent Labs? Yes, Component     Latest Ref Rng 12/11/2014  WBC     4.0 - 10.5 K/uL 7.3  RBC     3.87 - 5.11 MIL/uL 4.78  Hemoglobin     12.0 - 15.0 g/dL 13.3  HCT     36.0 - 46.0 % 40.4  MCV     78.0 - 100.0 fL 84.5  MCH     26.0 - 34.0 pg 27.8  MCHC     30.0 - 36.0 g/dL 32.9  RDW     11.5 - 15.5 % 13.8  Platelets     150 - 400 K/uL 219  MPV     8.6 - 12.4 fL 10.4  Neutrophils     43 - 77 % 67  NEUT#     1.7 - 7.7 K/uL 4.9  Lymphocytes     12 - 46 % 25  Lymphocyte #     0.7 - 4.0 K/uL 1.8  Monocytes Relative     3 - 12 % 7  Monocyte #     0.1 - 1.0 K/uL 0.5  Eosinophil     0 - 5 % 1  Eosinophils Absolute     0.0 - 0.7 K/uL 0.1  Basophil     0 - 1 % 0  Basophils Absolute     0.0 - 0.1 K/uL 0.0  Smear Review      Criteria for review not met  Sodium     135 - 145 mEq/L 138  Potassium     3.5 - 5.3 mEq/L 3.9  Chloride     96 - 112 mEq/L 103  CO2     19 - 32 mEq/L 26  Glucose     70 - 99 mg/dL 64 (L)  BUN     6 - 23 mg/dL 12  Creatinine     0.50 - 1.10 mg/dL 0.90  Total Bilirubin     0.2 - 1.2 mg/dL 0.5  Alkaline Phosphatase     39 - 117 U/L 51  AST     0 - 37 U/L 22  ALT     0 - 35 U/L 19  Total Protein     6.0 - 8.3 g/dL 6.0  Albumin     3.5 - 5.2 g/dL 4.0  Calcium     8.4 - 10.5 mg/dL 8.4   Cholesterol     0 - 200 mg/dL 147  Triglycerides     <150 mg/dL 125  HDL Cholesterol     >=46 mg/dL 49  Total CHOL/HDL Ratio      3.0  VLDL     0 - 40 mg/dL 25  LDL (calc)     0 - 99 mg/dL 73  Testosterone     10 - 70 ng/dL 17  Sex Hormone Binding  17 - 124 nmol/L 87  Testosterone Free     0.6 - 6.8 pg/mL 1.5  Testosterone-% Free     0.4 - 2.4 % 0.9  Chlamydia, Swab/Urine, PCR     NEGATIVE NEGATIVE  GC Probe Amp, Urine     NEGATIVE NEGATIVE  Chlamydia      Negative  Neisseria gonorrhea      Negative  Hemoglobin A1C     <5.7 % 5.1  Mean Plasma Glucose     <117 mg/dL 100  FSH      7.6  Prolactin      7.4  LH      10.7  TSH     0.350 - 4.500 uIU/mL 1.438  DHEA-SO4     51 - 321 ug/dL 222  CYTOLOGY - PAP      PAP RESULT - NORMAL  Free T4     0.80 - 1.80 ng/dL 0.92  Vit D, 25-Hydroxy     30 - 100 ng/mL 32    PCOS Labs & Referrals:   - Hgba1c annually if normal, every 3 months if abnormal:  Due 11/2015 - CMP annually if normal, as needed if abnormal:  Due 11/2015 - CBC annually if normal, as needed if abnormal:  Due 11/2015 - Lipid every 2 years if normal, annually if abnormal:  Due 11/2016 - Vitamin D annually if normal, as needed if abnormal: Due 11/2015 - Nutrition referral: Visit with Ozzie Hoyle 01/31/2015 - BH Screening: Done today   THIS RECORD MAY CONTAIN CONFIDENTIAL INFORMATION THAT SHOULD NOT BE RELEASED WITHOUT REVIEW OF THE SERVICE PROVIDER.  Adolescent Medicine Consultation Follow-Up Visit Yesenia Joseph  is a 19 y.o. female referred by Kandace Blitz, MD here today for follow-up of PCOS, anxiety and dyspareunia   Previsit planning completed:  yes  Growth Chart Viewed? not applicable   History was provided by the patient.  My Chart Activated?   yes   HPI:   Patient reports still having very irregular periods. She often skips 1-2 months between periods but does not have unexpected bleeding between periods. no acne or hair growth. Periods  are moderate and associated with some cramping.  She is still having diarrhea every morning with metformin, no change since decreasing dose from 1500 to 1059m. 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 She is doing better on her diet and thinks that speaking with a nutritionist helped with that. She has added more weights to her exercise routine which may also be contributing to her weight loss. She reports having lost 24 pounds since she started making these changes.   Patient reports only one sexual encounter since making the changes but thinks that the combination of pre-sex relaxation exercises and lubrication were very helpful with her dyspareunia.  She is becoming more anxious with new school year approaching. Uses the word "hate" a lot when talking about interuptions in her routine associated with going back to school and the pressure/stress associated with school. She reports her anxiety has been somewhat better over the summer because predictability and routine are very comforting to her so she is not looking forward to changing them. She is excited about starting student teaching with [employer]   Patient's last menstrual period was 02/06/2015 (exact date). Allergies  Allergen Reactions  . Anesthetics, Amide   . Anesthetics, Ester   . Anesthetics, Halogenated   . Codeine  Medication List       This list is accurate as of: 02/21/15  5:23 PM.  Always use your most recent med list.               levocetirizine 5 MG tablet  Commonly known as:  XYZAL  Take 5 mg by mouth daily.     lisdexamfetamine 50 MG capsule  Commonly known as:  VYVANSE  Take 50 mg by mouth every morning.     metFORMIN 500 MG 24 hr tablet  Commonly known as:  GLUCOPHAGE XR  Take 2 tablets (1,000 mg total) by mouth daily with supper.     norgestimate-ethinyl estradiol 0.25-35 MG-MCG tablet  Commonly known as:   SPRINTEC 28  Take 1 tablet by mouth daily.     vitamin C 100 MG tablet  Take 100 mg by mouth daily.        Social History: School:  attends college, Paediatric nurse education Nutrition/Eating Behaviors:  Making positive changes, limiting snacking, cutting sweet drinks Exercise:  working out daily, both cardio and weights seems to be more effective for her than previously just cardio Sleep:  no sleep issues  Tobacco?  no Drugs/ETOH?  no Partner preference?  female Sexually Active?  yes   Pregnancy Prevention:  condoms and birth control pills, reviewed condoms & plan B Safe at home, in school & in relationships?  Yes Safe to self?  Yes   The following portions of the patient's history were reviewed and updated as appropriate: allergies, current medications, past family history, past medical history, past social history, past surgical history and problem list.  Physical Exam:  Filed Vitals:   02/21/15 1552  BP: 100/69  Pulse: 83  Height: 5' 2"  (1.575 m)  Weight: 176 lb (79.833 kg)   BP 100/69 mmHg  Pulse 83  Ht 5' 2"  (1.575 m)  Wt 176 lb (79.833 kg)  BMI 32.18 kg/m2  LMP 02/06/2015 (Exact Date) Body mass index: body mass index is 32.18 kg/(m^2). Facility age limit for growth percentiles is 20 years.  Physical Exam  Constitutional: She is oriented to person, place, and time. She appears well-developed and well-nourished. No distress.  HENT:  Head: Normocephalic.  Eyes: Conjunctivae are normal.  Cardiovascular: Normal rate, regular rhythm, normal heart sounds and intact distal pulses.   No murmur heard. Pulmonary/Chest: Effort normal and breath sounds normal. No respiratory distress. She has no wheezes.  Abdominal: Soft. Bowel sounds are normal. She exhibits no distension. There is no tenderness.  Neurological: She is alert and oriented to person, place, and time.  Skin: Skin is warm and dry. No rash noted. She is not diaphoretic.  Psychiatric: She has a normal mood  and affect. Her behavior is normal.  Nursing note and vitals reviewed.    Assessment/Plan: PCOS: - continue current OCPs - will change to taking metformin with dinner to help with am diarrhea, if still not tolerating can try taking 1 tab with breakfast and 1 tab with dinner - continue positive changes to exercise and diet  Dyspareunia: significantly improved with relaxation and lubrication - continue current recs - f/u prn  Anxiety: - continue relaxation tecniques - f/u after school gets started - call if would like to speak to behavioral health before that  Follow-up:  Return in about 3 months (around 05/24/2015).   Medical decision-making:  > 25 minutes spent, more than 50% of appointment was spent discussing diagnosis and management of symptoms

## 2015-03-05 ENCOUNTER — Telehealth: Payer: Self-pay | Admitting: *Deleted

## 2015-03-05 NOTE — Telephone Encounter (Signed)
Prior Authorization received from [facility] for Metformin ER 500 mg.  PA form placed in provider box for completion. Clovis Pu, RN

## 2015-03-06 ENCOUNTER — Other Ambulatory Visit: Payer: Self-pay | Admitting: Family Medicine

## 2015-03-06 DIAGNOSIS — E282 Polycystic ovarian syndrome: Secondary | ICD-10-CM

## 2015-03-06 MED ORDER — METFORMIN HCL ER 500 MG PO TB24: 1000.0000 mg | ORAL_TABLET | Freq: Every day | ORAL | Status: DC

## 2015-03-06 MED ORDER — METFORMIN HCL ER (OSM) 500 MG PO TB24: 1000.0000 mg | ORAL_TABLET | Freq: Every day | ORAL | Status: DC

## 2015-05-24 ENCOUNTER — Ambulatory Visit: Payer: Self-pay | Admitting: Pediatrics

## 2015-06-19 ENCOUNTER — Encounter: Payer: Self-pay | Admitting: Pediatrics

## 2015-06-19 ENCOUNTER — Ambulatory Visit (INDEPENDENT_AMBULATORY_CARE_PROVIDER_SITE_OTHER): Payer: BLUE CROSS/BLUE SHIELD | Admitting: Pediatrics

## 2015-06-19 ENCOUNTER — Ambulatory Visit (INDEPENDENT_AMBULATORY_CARE_PROVIDER_SITE_OTHER): Payer: BLUE CROSS/BLUE SHIELD | Admitting: Clinical

## 2015-06-19 VITALS — BP 112/75 | HR 90 | Ht 62.6 in | Wt 178.6 lb

## 2015-06-19 DIAGNOSIS — F4322 Adjustment disorder with anxiety: Secondary | ICD-10-CM

## 2015-06-19 DIAGNOSIS — N946 Dysmenorrhea, unspecified: Secondary | ICD-10-CM

## 2015-06-19 DIAGNOSIS — E282 Polycystic ovarian syndrome: Secondary | ICD-10-CM | POA: Diagnosis not present

## 2015-06-19 MED ORDER — NORGESTIMATE-ETH ESTRADIOL 0.25-35 MG-MCG PO TABS: 1.0000 | ORAL_TABLET | Freq: Every day | ORAL | Status: DC

## 2015-06-19 MED ORDER — NAPROXEN 500 MG PO TABS: 500.0000 mg | ORAL_TABLET | Freq: Two times a day (BID) | ORAL | Status: DC

## 2015-06-19 MED ORDER — METFORMIN HCL ER 500 MG PO TB24: 1000.0000 mg | ORAL_TABLET | Freq: Every day | ORAL | Status: DC

## 2015-06-19 NOTE — Progress Notes (Signed)
THIS RECORD MAY CONTAIN CONFIDENTIAL INFORMATION THAT SHOULD NOT BE RELEASED WITHOUT REVIEW OF THE SERVICE PROVIDER.  Adolescent Medicine Consultation Follow-Up Visit Yesenia Joseph  is a 19 y.o. female referred by Kandace Blitz, MD here today for follow-up.    Previsit planning completed:  yes Pre-Visit Planning  Yesenia Joseph  is a 19 y.o. female referred by Sydell Axon, MD.   Last seen in [facility] on 02/21/2015 for PCOS and anxiety.   Previous Psych Screenings?  No  Treatment plan at last visit included continue current OCPs, change metformin due to stomach upset, discussed relaxation skills.   Clinical Staff Visit Tasks:   - Urine GC/CT due? no - Psych Screenings Due? Complete at next visit  Provider Visit Tasks: - Assess PCOS symptoms - Pertinent Labs? No  PCOS Labs & Referrals:  - Hgba1c annually if normal, every 3 months if abnormal: Due 11/2015 - CMP annually if normal, as needed if abnormal: Due 11/2015 - CBC annually if normal, as needed if abnormal: Due 11/2015 - Lipid every 2 years if normal, annually if abnormal: Due 11/2016 - Vitamin D annually if normal, as needed if abnormal: Due 11/2015 - Nutrition referral: Visit with Ozzie Hoyle 01/31/2015 - [place] Screening: Due NOW  Growth Chart Viewed? no   History was provided by the patient.  PCP Confirmed?  yes  My Chart Activated?   yes   HPI:    Has worked really hard through diet and exercise  Vyvanse was increased to 60 mg but will probably go back down to 50 mg.  Has been on vyvanse for a long time and does not seem to contribute to her anxiety  Periods are still very irregular.  Did have 2 consecutive periods, with cramping.  Used icy hot which helped.  Used essential oils which helped some.  Took Midol which helped for a few hours.  Has had GI upset and still has not improved.  Occurred without metformin.  Discussed talking with PCP about GI symptoms.  Also suggested going dairy free,  may be lactose intolerant.   Patient's last menstrual period was 05/07/2015 (exact date). Allergies  Allergen Reactions  . Anesthetics, Amide   . Anesthetics, Ester   . Anesthetics, Halogenated   . Codeine    Current Outpatient Prescriptions on File Prior to Visit  Medication Sig Dispense Refill  . Ascorbic Acid (VITAMIN C) 100 MG tablet Take 100 mg by mouth daily.    Marland Kitchen lisdexamfetamine (VYVANSE) 50 MG capsule Take 50 mg by mouth every morning.     No current facility-administered medications on file prior to visit.    Social History:  Confidentiality was discussed with the patient and if applicable, with caregiver as well.  Tobacco?  no Drugs/ETOH?  yes, occasional alcohol Partner preference?  female Sexually Active?  yes   Pregnancy Prevention:  birth control pills, reviewed condoms & plan B Safe at home, in school & in relationships?  Yes Safe to self?  Yes   The following portions of the patient's history were reviewed and updated as appropriate: allergies, current medications, past social history and problem list.  Physical Exam:  Filed Vitals:   06/19/15 1330  BP: 112/75  Pulse: 90  Height: 5' 2.6" (1.59 m)  Weight: 178 lb 9.6 oz (81.012 kg)   BP 112/75 mmHg  Pulse 90  Ht 5' 2.6" (1.59 m)  Wt 178 lb 9.6 oz (81.012 kg)  BMI 32.04 kg/m2  LMP 05/07/2015 (Exact Date) Body  mass index: body mass index is 32.04 kg/(m^2). Facility age limit for growth percentiles is 20 years.  Physical Exam  Constitutional: No distress.  Neck: No thyromegaly present.  Cardiovascular: Normal rate and regular rhythm.   No murmur heard. Pulmonary/Chest: Breath sounds normal.  Abdominal: Soft. There is no tenderness. There is no guarding.  Musculoskeletal: She exhibits no edema.  Lymphadenopathy:    She has no cervical adenopathy.  Neurological: She is alert.  Nursing note and vitals reviewed.    Assessment/Plan: 1. PCOS (polycystic ovarian syndrome) Patient has seen tremendous  change in her body although relatively little weight change.  She is very happy with the results and is committed to continue to leave healthy lifestyle. - metFORMIN (GLUCOPHAGE-XR) 500 MG 24 hr tablet; Take 2 tablets (1,000 mg total) by mouth daily with breakfast.  Dispense: 60 tablet; Refill: 11 - norgestimate-ethinyl estradiol (SPRINTEC 28) 0.25-35 MG-MCG tablet; Take 1 tablet by mouth daily.  Dispense: 1 Package; Refill: 11  2. Dysmenorrhea - naproxen (NAPROSYN) 500 MG tablet; Take 1 tablet (500 mg total) by mouth 2 (two) times daily with a meal.  Dispense: 30 tablet; Refill: 1   Discussed need to transition to adult care.  Pt is working on finding adult PCP. Discussed anxiety and stress management.  Pt met with [facility].  Will f/u with BH.  Consider SSRI in future.  Follow-up:  Return in about 3 months (around 09/17/2015) for PCOS, with Dr. Henrene Pastor.   Medical decision-making:  > 25 minutes spent, more than 50% of appointment was spent discussing diagnosis and management of symptoms

## 2015-06-19 NOTE — Progress Notes (Signed)
Pre-Visit Planning  Yesenia SalvoKayla D Joseph  is a 19 y.o. female referred by Fredderick SeveranceBATES,MELISA K, MD.   [facility] for PCOS and anxiety.   Previous Psych Screenings?  No  Treatment plan at last visit included continue current OCPs, change metformin due to stomach upset, discussed relaxation skills.   Clinical Staff Visit Tasks:   - Urine GC/CT due? no - Psych Screenings Due? Yes PHQSADs  Provider Visit Tasks: - Assess PCOS symptoms - Pertinent Labs? No  PCOS Labs & Referrals:  - Hgba1c annually if normal, every 3 months if abnormal: Due 11/2015 - CMP annually if normal, as needed if abnormal: Due 11/2015 - CBC annually if normal, as needed if abnormal: Due 11/2015 - Lipid every 2 years if normal, annually if abnormal: Due 11/2016 - Vitamin D annually if normal, as needed if abnormal: Due 11/2015 - Nutrition referral: Visit with Denny LevyLaura Reavis 01/31/2015 - [facility] Screening: Due NOW

## 2015-06-19 NOTE — BH Specialist Note (Signed)
Referring Provider: Lenore Cordia, MD Session Time:  14:30 - 1500 (30 minutes) Type of Service: [place] Interpreter: No.  Interpreter Name & Language: n/a   PRESENTING CONCERNS:  TODD ARGABRIGHT is a 19 y.o. female brought in by herself. ZENORA KARPEL was referred to [facility] for symptoms of anxiety.   GOALS ADDRESSED:  Assess psychosocial needs Reduce symptoms of anxiety as evidenced by self-report   INTERVENTIONS:  Psychoeducation regarding CBT (CBT triangle) Built rapport   ASSESSMENT/OUTCOME:  Lonn Georgia met with this [place] Intern; she was well-groomed, appropriately dressed, and presented with positive affect. This [place] Intern described the CBT triangle and what follow-ups with [place] may look like. The client reported that deep breathing has been helpful that she learned from the [facility] before, but that she often also feels very overwhelmed in situations and has little time due to her busy schedule.  Ashli reported that she thinks that learning more techniques from [facility] to manage her anxiety will be helpful. She reported that she thinks that having a concrete way to organize and manage her daily requirements and make decisions will be helpful.   TREATMENT PLAN:  Continue to practice deep breathing. Problem solving Worry tree   PLAN FOR NEXT VISIT: Review CBT triangle    Scheduled next visit: Weds. Dec. 14th at [place]

## 2015-06-21 ENCOUNTER — Ambulatory Visit: Payer: BLUE CROSS/BLUE SHIELD | Admitting: Pediatrics

## 2015-06-26 ENCOUNTER — Ambulatory Visit: Payer: BLUE CROSS/BLUE SHIELD | Admitting: Clinical

## 2015-06-28 DIAGNOSIS — N946 Dysmenorrhea, unspecified: Secondary | ICD-10-CM | POA: Insufficient documentation

## 2015-08-19 ENCOUNTER — Telehealth: Payer: Self-pay | Admitting: Pediatrics

## 2015-08-19 NOTE — Telephone Encounter (Signed)
Moving up to the 4th floor-needed to reschedule-LVM and sent email to the patient in order to reschedule this appointment.

## 2015-09-13 ENCOUNTER — Ambulatory Visit: Payer: Self-pay | Admitting: Pediatrics

## 2016-05-18 ENCOUNTER — Other Ambulatory Visit: Payer: Self-pay | Admitting: *Deleted

## 2016-05-18 ENCOUNTER — Other Ambulatory Visit: Payer: Self-pay | Admitting: Pediatrics

## 2016-05-18 DIAGNOSIS — E282 Polycystic ovarian syndrome: Secondary | ICD-10-CM

## 2016-05-18 MED ORDER — NORGESTIMATE-ETH ESTRADIOL 0.25-35 MG-MCG PO TABS: 1.0000 | ORAL_TABLET | Freq: Every day | ORAL | 0 refills | Status: DC

## 2016-05-18 NOTE — Telephone Encounter (Signed)
Refill x 1 provided.  Please notify patient of need to identify a new provider and ask if she needs assistance with this.

## 2016-05-18 NOTE — Addendum Note (Signed)
Addended by: Delorse LekPERRY, Lagena Strand F on: 05/18/2016 02:17 PM   Modules accepted: Orders

## 2016-05-18 NOTE — Telephone Encounter (Signed)
VM from [facility]. Requesting rx refill on pt's Sprintec.   Pt was last seen for OV 06/19/15, and is now 20 y.o.   Will route to provider to make aware.

## 2016-05-18 NOTE — Telephone Encounter (Signed)
TC to pt. Updated that refill x 1 has been provided. Notified patient of need to identify a new provider. Pt reports that she does have PCP established, but is not seeing a provider specifically for PCOS management. Pt would like recommendation for PCOS mgmt.

## 2016-05-19 NOTE — Telephone Encounter (Signed)
If patient is going to establish care with a gynecologist for her gynecological care, I would recommend she ask if that practice manages PCOS. Otherwise, she can ask the PCP of her choice if they manage stable PCOS patients. Please advise if she has further questions.

## 2016-05-20 NOTE — Telephone Encounter (Signed)
LVM w/ pt. Callback requested to discuss f/u care. Phone number provided.

## 2016-05-22 NOTE — Telephone Encounter (Signed)
TC to pt. LVM advising that if patient is going to establish care with a gynecologist for her gynecological care, I would recommend she ask if that practice manages PCOS. Otherwise, she can ask the PCP of her choice if they manage stable PCOS patients. Request callback if she has further questions.

## 2018-07-13 NOTE — L&D Delivery Note (Addendum)
Delivery Note Pt pushed very well for delivery.  At 1:09 PM a viable and healthy female was delivered via Vaginal, Spontaneous (Presentation: OA; LOT ).  APGAR:8, 9 ; weight  P.   Placenta status: delivered, intact .  Cord: 3V with the following complications: none.    Anesthesia:  epidural Episiotomy: None Lacerations: R labial Suture Repair: 3.0 vicryl rapide Est. Blood Loss (mL):  127cc  Mom to postpartum.  Baby to Couplet care / Skin to Skin.  Montserrat Shek Bovard-Stuckert 02/19/2019, 2:01 PM  Br/A+/RI/Tdap in PNC/Contra?

## 2018-07-25 DIAGNOSIS — Z3A10 10 weeks gestation of pregnancy: Secondary | ICD-10-CM | POA: Diagnosis not present

## 2018-07-25 DIAGNOSIS — Z3401 Encounter for supervision of normal first pregnancy, first trimester: Secondary | ICD-10-CM | POA: Diagnosis not present

## 2018-07-25 DIAGNOSIS — Z113 Encounter for screening for infections with a predominantly sexual mode of transmission: Secondary | ICD-10-CM | POA: Diagnosis not present

## 2018-07-25 DIAGNOSIS — O26891 Other specified pregnancy related conditions, first trimester: Secondary | ICD-10-CM | POA: Diagnosis not present

## 2018-07-25 DIAGNOSIS — Z3689 Encounter for other specified antenatal screening: Secondary | ICD-10-CM | POA: Diagnosis not present

## 2018-07-25 LAB — OB RESULTS CONSOLE GC/CHLAMYDIA
Chlamydia: NEGATIVE
Gonorrhea: NEGATIVE

## 2018-07-25 LAB — OB RESULTS CONSOLE HEPATITIS B SURFACE ANTIGEN: Hepatitis B Surface Ag: NEGATIVE

## 2018-07-25 LAB — OB RESULTS CONSOLE ABO/RH: RH Type: POSITIVE

## 2018-07-25 LAB — OB RESULTS CONSOLE RUBELLA ANTIBODY, IGM: Rubella: IMMUNE

## 2018-07-25 LAB — OB RESULTS CONSOLE HIV ANTIBODY (ROUTINE TESTING): HIV: NONREACTIVE

## 2018-07-25 LAB — OB RESULTS CONSOLE RPR: RPR: NONREACTIVE

## 2018-07-25 LAB — OB RESULTS CONSOLE ANTIBODY SCREEN: Antibody Screen: NEGATIVE

## 2018-09-21 DIAGNOSIS — Z363 Encounter for antenatal screening for malformations: Secondary | ICD-10-CM | POA: Diagnosis not present

## 2018-09-21 DIAGNOSIS — Z3A18 18 weeks gestation of pregnancy: Secondary | ICD-10-CM | POA: Diagnosis not present

## 2018-09-21 DIAGNOSIS — Z3689 Encounter for other specified antenatal screening: Secondary | ICD-10-CM | POA: Diagnosis not present

## 2018-10-07 DIAGNOSIS — Z3A20 20 weeks gestation of pregnancy: Secondary | ICD-10-CM | POA: Diagnosis not present

## 2018-10-07 DIAGNOSIS — Z362 Encounter for other antenatal screening follow-up: Secondary | ICD-10-CM | POA: Diagnosis not present

## 2018-11-25 DIAGNOSIS — Z23 Encounter for immunization: Secondary | ICD-10-CM | POA: Diagnosis not present

## 2018-11-25 DIAGNOSIS — Z3689 Encounter for other specified antenatal screening: Secondary | ICD-10-CM | POA: Diagnosis not present

## 2019-01-26 ENCOUNTER — Ambulatory Visit: Payer: BC Managed Care – PPO | Admitting: Podiatry

## 2019-01-26 ENCOUNTER — Encounter: Payer: Self-pay | Admitting: Podiatry

## 2019-01-26 ENCOUNTER — Other Ambulatory Visit: Payer: Self-pay

## 2019-01-26 VITALS — BP 111/73 | Temp 98.0°F

## 2019-01-26 DIAGNOSIS — S90211A Contusion of right great toe with damage to nail, initial encounter: Secondary | ICD-10-CM | POA: Diagnosis not present

## 2019-01-26 DIAGNOSIS — L600 Ingrowing nail: Secondary | ICD-10-CM | POA: Diagnosis not present

## 2019-01-26 NOTE — Progress Notes (Signed)
Subjective:   Patient ID: Yesenia Joseph, female   DOB: 23 y.o.   MRN: [id number]   HPI Patient presents stating that she slipped on a rock in her leg and she traumatized her right big toenail and it is been very sore and draining and that this occurred about a week ago.  Patient states she has trouble wearing any form of shoes and patient does not smoke likes to be active   Review of Systems  All other systems reviewed and are negative.       Objective:  Physical Exam Vitals signs and nursing note reviewed.  Constitutional:      Appearance: She is well-developed.  Pulmonary:     Effort: Pulmonary effort is normal.  Musculoskeletal: Normal range of motion.  Skin:    General: Skin is warm.  Neurological:     Mental Status: She is alert.     Neurovascular status intact muscle strength adequate range of motion within normal limits with patient found to have a severely traumatized right hallux nail that is moderately loose and painful with drainage noted upon palpation with patient on current antibiotic.  Patient is found to have good digital perfusion well oriented x3     Assessment:  Severely traumatized right hallux nail that is loose and painful     Plan:  H&P condition reviewed and recommended nail removal explaining procedure to the patient.  Patient wants this done understanding risk and at this point I went ahead and I infiltrated the right hallux 60 mg Xylocaine Marcaine mixture I carefully removed the hallux nail I flushed the bed to allow for any drainage to be removed and I applied sterile dressing.  Reappoint for [place] to recheck

## 2019-01-26 NOTE — Patient Instructions (Signed)

## 2019-01-27 DIAGNOSIS — Z3685 Encounter for antenatal screening for Streptococcus B: Secondary | ICD-10-CM | POA: Diagnosis not present

## 2019-01-27 LAB — OB RESULTS CONSOLE GBS: GBS: POSITIVE

## 2019-02-09 ENCOUNTER — Telehealth (HOSPITAL_COMMUNITY): Payer: Self-pay | Admitting: *Deleted

## 2019-02-09 ENCOUNTER — Encounter (HOSPITAL_COMMUNITY): Payer: Self-pay | Admitting: *Deleted

## 2019-02-09 NOTE — Telephone Encounter (Signed)
Preadmission screen  

## 2019-02-18 ENCOUNTER — Other Ambulatory Visit (HOSPITAL_COMMUNITY)
Admission: RE | Admit: 2019-02-18 | Discharge: 2019-02-18 | Disposition: A | Discharge status: Home / Self Care | Payer: BC Managed Care – PPO | Source: Ambulatory Visit | Attending: Obstetrics and Gynecology | Admitting: Obstetrics and Gynecology

## 2019-02-18 ENCOUNTER — Other Ambulatory Visit: Payer: Self-pay

## 2019-02-18 DIAGNOSIS — Z01818 Encounter for other preprocedural examination: Secondary | ICD-10-CM | POA: Insufficient documentation

## 2019-02-18 DIAGNOSIS — Z20828 Contact with and (suspected) exposure to other viral communicable diseases: Secondary | ICD-10-CM | POA: Insufficient documentation

## 2019-02-18 LAB — SARS CORONAVIRUS 2 (TAT 6-24 HRS): SARS Coronavirus 2: NEGATIVE

## 2019-02-18 NOTE — MAU Note (Signed)
Here for PAT covid swab, denies any symptoms 

## 2019-02-19 ENCOUNTER — Inpatient Hospital Stay (HOSPITAL_COMMUNITY): Payer: BC Managed Care – PPO | Admitting: Anesthesiology

## 2019-02-19 ENCOUNTER — Other Ambulatory Visit: Payer: Self-pay

## 2019-02-19 ENCOUNTER — Encounter (HOSPITAL_COMMUNITY): Payer: Self-pay | Admitting: *Deleted

## 2019-02-19 ENCOUNTER — Inpatient Hospital Stay (HOSPITAL_COMMUNITY)
Admission: AD | Admit: 2019-02-19 | Discharge: 2019-02-21 | DRG: 807 | Disposition: A | Discharge status: Home / Self Care | Payer: BC Managed Care – PPO | Attending: Obstetrics and Gynecology | Admitting: Obstetrics and Gynecology

## 2019-02-19 DIAGNOSIS — Z3490 Encounter for supervision of normal pregnancy, unspecified, unspecified trimester: Secondary | ICD-10-CM

## 2019-02-19 DIAGNOSIS — O26893 Other specified pregnancy related conditions, third trimester: Secondary | ICD-10-CM | POA: Diagnosis not present

## 2019-02-19 DIAGNOSIS — Z3A40 40 weeks gestation of pregnancy: Secondary | ICD-10-CM | POA: Diagnosis not present

## 2019-02-19 DIAGNOSIS — O99824 Streptococcus B carrier state complicating childbirth: Principal | ICD-10-CM | POA: Diagnosis present

## 2019-02-19 DIAGNOSIS — Z23 Encounter for immunization: Secondary | ICD-10-CM | POA: Diagnosis not present

## 2019-02-19 LAB — RPR: RPR Ser Ql: NONREACTIVE

## 2019-02-19 LAB — ABO/RH: ABO/RH(D): A POS

## 2019-02-19 LAB — CBC
HCT: 37.3 % (ref 36.0–46.0)
Hemoglobin: 12 g/dL (ref 12.0–15.0)
MCH: 27.6 pg (ref 26.0–34.0)
MCHC: 32.2 g/dL (ref 30.0–36.0)
MCV: 85.9 fL (ref 80.0–100.0)
Platelets: 198 10*3/uL (ref 150–400)
RBC: 4.34 MIL/uL (ref 3.87–5.11)
RDW: 13.5 % (ref 11.5–15.5)
WBC: 14.1 10*3/uL — ABNORMAL HIGH (ref 4.0–10.5)
nRBC: 0 % (ref 0.0–0.2)

## 2019-02-19 LAB — TYPE AND SCREEN
ABO/RH(D): A POS
Antibody Screen: NEGATIVE

## 2019-02-19 MED ORDER — DIPHENHYDRAMINE HCL 25 MG PO CAPS: 25.0000 mg | ORAL_CAPSULE | Freq: Four times a day (QID) | ORAL | Status: DC | PRN

## 2019-02-19 MED ORDER — EPHEDRINE 5 MG/ML INJ: 10.0000 mg | INTRAVENOUS | Status: DC | PRN

## 2019-02-19 MED ORDER — TETANUS-DIPHTH-ACELL PERTUSSIS 5-2.5-18.5 LF-MCG/0.5 IM SUSP: 0.5000 mL | Freq: Once | INTRAMUSCULAR | Status: DC

## 2019-02-19 MED ORDER — OXYCODONE-ACETAMINOPHEN 5-325 MG PO TABS: 2.0000 | ORAL_TABLET | ORAL | Status: DC | PRN

## 2019-02-19 MED ORDER — SENNOSIDES-DOCUSATE SODIUM 8.6-50 MG PO TABS
2.0000 | ORAL_TABLET | ORAL | Status: DC
  Administered 2019-02-20 (×2): 2 via ORAL
  Filled 2019-02-19 (×2): qty 2

## 2019-02-19 MED ORDER — OXYCODONE-ACETAMINOPHEN 5-325 MG PO TABS: 1.0000 | ORAL_TABLET | ORAL | Status: DC | PRN

## 2019-02-19 MED ORDER — OXYCODONE HCL 5 MG PO TABS: 5.0000 mg | ORAL_TABLET | ORAL | Status: DC | PRN

## 2019-02-19 MED ORDER — PRENATAL MULTIVITAMIN CH
1.0000 | ORAL_TABLET | Freq: Every day | ORAL | Status: DC
  Administered 2019-02-20 – 2019-02-21 (×2): 1 via ORAL
  Filled 2019-02-19 (×2): qty 1

## 2019-02-19 MED ORDER — TERBUTALINE SULFATE 1 MG/ML IJ SOLN: 0.2500 mg | Freq: Once | INTRAMUSCULAR | Status: DC | PRN

## 2019-02-19 MED ORDER — SODIUM CHLORIDE 0.9 % IV SOLN
2.0000 g | Freq: Four times a day (QID) | INTRAVENOUS | Status: DC
  Filled 2019-02-19 (×5): qty 2000

## 2019-02-19 MED ORDER — DIBUCAINE (PERIANAL) 1 % EX OINT: 1.0000 "application " | TOPICAL_OINTMENT | CUTANEOUS | Status: DC | PRN

## 2019-02-19 MED ORDER — BENZOCAINE-MENTHOL 20-0.5 % EX AERO: 1.0000 "application " | INHALATION_SPRAY | CUTANEOUS | Status: DC | PRN

## 2019-02-19 MED ORDER — FENTANYL CITRATE (PF) 100 MCG/2ML IJ SOLN
100.0000 ug | Freq: Once | INTRAMUSCULAR | Status: AC
  Administered 2019-02-19: 100 ug via INTRAVENOUS

## 2019-02-19 MED ORDER — PHENYLEPHRINE 40 MCG/ML (10ML) SYRINGE FOR IV PUSH (FOR BLOOD PRESSURE SUPPORT): 80.0000 ug | PREFILLED_SYRINGE | INTRAVENOUS | Status: DC | PRN

## 2019-02-19 MED ORDER — ONDANSETRON HCL 4 MG PO TABS: 4.0000 mg | ORAL_TABLET | ORAL | Status: DC | PRN

## 2019-02-19 MED ORDER — COCONUT OIL OIL: 1.0000 "application " | TOPICAL_OIL | Status: DC | PRN

## 2019-02-19 MED ORDER — FENTANYL-BUPIVACAINE-NACL 0.5-0.125-0.9 MG/250ML-% EP SOLN
12.0000 mL/h | EPIDURAL | Status: DC | PRN
  Filled 2019-02-19: qty 250

## 2019-02-19 MED ORDER — FENTANYL CITRATE (PF) 100 MCG/2ML IJ SOLN
INTRAMUSCULAR | Status: AC
  Filled 2019-02-19: qty 2

## 2019-02-19 MED ORDER — IBUPROFEN 600 MG PO TABS
600.0000 mg | ORAL_TABLET | Freq: Four times a day (QID) | ORAL | Status: DC
  Administered 2019-02-19 – 2019-02-21 (×8): 600 mg via ORAL
  Filled 2019-02-19 (×8): qty 1

## 2019-02-19 MED ORDER — ACETAMINOPHEN 325 MG PO TABS
650.0000 mg | ORAL_TABLET | ORAL | Status: DC | PRN
  Administered 2019-02-20: 650 mg via ORAL
  Filled 2019-02-19: qty 2

## 2019-02-19 MED ORDER — LACTATED RINGERS IV SOLN: INTRAVENOUS | Status: DC

## 2019-02-19 MED ORDER — LACTATED RINGERS IV SOLN
INTRAVENOUS | Status: DC
  Administered 2019-02-19: 06:00:00 via INTRAVENOUS

## 2019-02-19 MED ORDER — LACTATED RINGERS IV SOLN: 500.0000 mL | INTRAVENOUS | Status: DC | PRN

## 2019-02-19 MED ORDER — WITCH HAZEL-GLYCERIN EX PADS: 1.0000 "application " | MEDICATED_PAD | CUTANEOUS | Status: DC | PRN

## 2019-02-19 MED ORDER — SODIUM CHLORIDE 0.9 % IV SOLN
2.0000 g | Freq: Once | INTRAVENOUS | Status: AC
  Administered 2019-02-19: 2 g via INTRAVENOUS
  Filled 2019-02-19: qty 2000

## 2019-02-19 MED ORDER — ACETAMINOPHEN 325 MG PO TABS: 650.0000 mg | ORAL_TABLET | ORAL | Status: DC | PRN

## 2019-02-19 MED ORDER — ONDANSETRON HCL 4 MG/2ML IJ SOLN: 4.0000 mg | INTRAMUSCULAR | Status: DC | PRN

## 2019-02-19 MED ORDER — SIMETHICONE 80 MG PO CHEW: 80.0000 mg | CHEWABLE_TABLET | ORAL | Status: DC | PRN

## 2019-02-19 MED ORDER — LACTATED RINGERS IV SOLN: 500.0000 mL | Freq: Once | INTRAVENOUS | Status: DC

## 2019-02-19 MED ORDER — LIDOCAINE HCL (PF) 1 % IJ SOLN: 30.0000 mL | INTRAMUSCULAR | Status: DC | PRN

## 2019-02-19 MED ORDER — SODIUM CHLORIDE (PF) 0.9 % IJ SOLN
INTRAMUSCULAR | Status: DC | PRN
  Administered 2019-02-19: 12 mL/h via EPIDURAL

## 2019-02-19 MED ORDER — ZOLPIDEM TARTRATE 5 MG PO TABS: 5.0000 mg | ORAL_TABLET | Freq: Every evening | ORAL | Status: DC | PRN

## 2019-02-19 MED ORDER — DIPHENHYDRAMINE HCL 50 MG/ML IJ SOLN: 12.5000 mg | INTRAMUSCULAR | Status: DC | PRN

## 2019-02-19 MED ORDER — FENTANYL-BUPIVACAINE-NACL 0.5-0.125-0.9 MG/250ML-% EP SOLN: 12.0000 mL/h | EPIDURAL | Status: DC | PRN

## 2019-02-19 MED ORDER — OXYCODONE HCL 5 MG PO TABS: 10.0000 mg | ORAL_TABLET | ORAL | Status: DC | PRN

## 2019-02-19 MED ORDER — FLEET ENEMA 7-19 GM/118ML RE ENEM: 1.0000 | ENEMA | Freq: Every day | RECTAL | Status: DC | PRN

## 2019-02-19 MED ORDER — OXYTOCIN 40 UNITS IN NORMAL SALINE INFUSION - SIMPLE MED: 2.5000 [IU]/h | INTRAVENOUS | Status: DC

## 2019-02-19 MED ORDER — OXYTOCIN BOLUS FROM INFUSION
500.0000 mL | Freq: Once | INTRAVENOUS | Status: AC
  Administered 2019-02-19: 500 mL via INTRAVENOUS

## 2019-02-19 MED ORDER — OXYTOCIN 40 UNITS IN NORMAL SALINE INFUSION - SIMPLE MED
1.0000 m[IU]/min | INTRAVENOUS | Status: DC
  Administered 2019-02-19: 2 m[IU]/min via INTRAVENOUS
  Filled 2019-02-19: qty 1000

## 2019-02-19 MED ORDER — ONDANSETRON HCL 4 MG/2ML IJ SOLN
4.0000 mg | Freq: Four times a day (QID) | INTRAMUSCULAR | Status: DC | PRN
  Administered 2019-02-19: 4 mg via INTRAVENOUS
  Filled 2019-02-19: qty 2

## 2019-02-19 MED ORDER — SOD CITRATE-CITRIC ACID 500-334 MG/5ML PO SOLN: 30.0000 mL | ORAL | Status: DC | PRN

## 2019-02-19 MED ORDER — LIDOCAINE HCL (PF) 1 % IJ SOLN
INTRAMUSCULAR | Status: DC | PRN
  Administered 2019-02-19 (×2): 5 mL via EPIDURAL

## 2019-02-19 NOTE — Progress Notes (Signed)
Patient ID: Yesenia Joseph, female   DOB: 20-Mar-1996, 23 y.o.   MRN: [id number]   Reviewed H&P, no changes; comfortable w epidural  AFVSS gen NAD FHTs 120's, mod var, + accel, + scalp stim, category 1 toco q 2-68min  AROM clear fluid, w/o diff/ comp  5/90/-1  Continue current mgmt

## 2019-02-19 NOTE — MAU Note (Signed)
Pt reports to MAU c/o ctx every 4-5 min. No bleeding or LOF. +FM.

## 2019-02-19 NOTE — Lactation Note (Signed)
This note was copied from a baby's chart. Lactation Consultation Note  Patient Name: Yesenia Joseph Yesenia Joseph Date: 02/19/2019 Reason for consult: Initial assessment;Term;Primapara;1st time breastfeeding  LC in to visit with P6 Mom of term baby at 27 hrs old.  Mom holding baby swaddled in blankets over her gown.  Talked about benefits of STS and stimulating baby to cue to feed at the breast.   Reviewed breast massage and hand expression.  Mom has an easy flow of colostrum.    Baby tried in cross cradle hold and football hold on left breast.  Mom has erect nipples with compressible areola.   Baby fussy when handled.  Burped baby well and then baby latched with assistance and sucked for a couple minutes before arching and popping off the breast.  Repeatedly re-latched baby deeply onto breast.  Lots of colostrum expressed into baby's mouth.  Mom given a lot of teaching on positioning and what a deep latch looks like.  Mom instructed on hand expression into spoon.  Right now baby is asleep in football hold against Mom's breast.   Encouraged continued STS, and watching baby for feeding cues.  Encouraged Mom to ask for help prn.  Lactation brochure left in room and Mom aware of IP and OP lactation support available to her.  Maternal Data Formula Feeding for Exclusion: No Has patient been taught Hand Expression?: Yes Does the patient have breastfeeding experience prior to this delivery?: No  Feeding Feeding Type: Breast Fed  LATCH Score Latch: Repeated attempts needed to sustain latch, nipple held in mouth throughout feeding, stimulation needed to elicit sucking reflex.  Audible Swallowing: A few with stimulation  Type of Nipple: Everted at rest and after stimulation  Comfort (Breast/Nipple): Soft / non-tender  Hold (Positioning): Assistance needed to correctly position infant at breast and maintain latch.  LATCH Score: 7  Interventions Interventions: Breast feeding basics  reviewed;Assisted with latch;Skin to skin;Breast massage;Hand express;Breast compression;Adjust position;Support pillows;Position options;Expressed milk  Lactation Tools Discussed/Used     Consult Status Consult Status: Follow-up Date: 02/20/19 Follow-up type: In-patient    Broadus John 02/19/2019, 6:18 PM

## 2019-02-19 NOTE — Anesthesia Preprocedure Evaluation (Signed)
Anesthesia Evaluation  Patient identified by MRN, date of birth, ID band Patient awake    Reviewed: Allergy & Precautions, H&P , NPO status , Patient's Chart, lab work & pertinent test results  History of Anesthesia Complications (+) PONVNegative for: history of anesthetic complications  Airway Mallampati: II  TM Distance: >3 FB Neck ROM: Full    Dental no notable dental hx. (+) Teeth Intact   Pulmonary neg pulmonary ROS,    Pulmonary exam normal breath sounds clear to auscultation       Cardiovascular negative cardio ROS Normal cardiovascular exam Rhythm:Regular Rate:Normal     Neuro/Psych negative neurological ROS  negative psych ROS   GI/Hepatic negative GI ROS, Neg liver ROS,   Endo/Other  negative endocrine ROS  Renal/GU negative Renal ROS  negative genitourinary   Musculoskeletal negative musculoskeletal ROS (+)   Abdominal   Peds negative pediatric ROS (+)  Hematology negative hematology ROS (+)   Anesthesia Other Findings   Reproductive/Obstetrics (+) Pregnancy                             Anesthesia Physical Anesthesia Plan  ASA: II  Anesthesia Plan: Epidural   Post-op Pain Management:    Induction:   PONV Risk Score and Plan:   Airway Management Planned:   Additional Equipment:   Intra-op Plan:   Post-operative Plan:   Informed Consent: I have reviewed the patients History and Physical, chart, labs and discussed the procedure including the risks, benefits and alternatives for the proposed anesthesia with the patient or authorized representative who has indicated his/her understanding and acceptance.       Plan Discussed with:   Anesthesia Plan Comments:         Anesthesia Quick Evaluation

## 2019-02-19 NOTE — H&P (Signed)
Yesenia Joseph is a 23 y.o. female G1P0 at 42+ in labor.  Regular, painful contractions - cervical change to 5 cm in MAU.  GBBS positive.  Received Tdap in [facility].  Relatively uncomplicated PNC.  Pregnancy dated by LMP.  OB History    Gravida  1   Para      Term      Preterm      AB      Living        SAB      TAB      Ectopic      Multiple      Live Births            G1 present  No abn pap No STD  Past Medical History:  Diagnosis Date  . Complication of anesthesia   . Hyperthyroidism   . PCOS (polycystic ovarian syndrome)   . PONV (postoperative nausea and vomiting)   . Pre-diabetes    Past Surgical History:  Procedure Laterality Date  . TONSILLECTOMY    . WISDOM TOOTH EXTRACTION     Family History: family history includes Cancer in her maternal grandfather and paternal grandmother; Diabetes in her paternal grandfather and another family member; Hyperlipidemia in an other family member; Hypertension in an other family member. Social History:  reports that she has never smoked. She has never used smokeless tobacco. She reports that she does not drink alcohol or use drugs.   Meds Pepcid, PNV All hydrocodone, anesthesia gives N and V     Maternal Diabetes: No Genetic Screening: Normal Maternal Ultrasounds/Referrals: Normal Fetal Ultrasounds or other Referrals:  None Maternal Substance Abuse:  No Significant Maternal Medications:  None Significant Maternal Lab Results:  Group B Strep positive Other Comments:  None  Review of Systems  Constitutional: Negative.   HENT: Negative.   Eyes: Negative.   Respiratory: Negative.   Cardiovascular: Negative.   Gastrointestinal: Negative.   Genitourinary: Negative.   Musculoskeletal: Negative.   Skin: Negative.   Neurological: Negative.   Psychiatric/Behavioral: Negative.    Maternal Medical History:  Reason for admission: Contractions.   Contractions: Onset was 6-12 hours ago.   Frequency: regular.     Fetal activity: Perceived fetal activity is normal.    Prenatal Complications - Diabetes: none.    Dilation: 5 Effacement (%): 50 Station: -2 Exam by:: Denyse Dago, RN Blood pressure 101/72, pulse 74, temperature 98.2 F (36.8 C), temperature source Oral, resp. rate 18, height 5\' 2"  (1.575 m), weight 97 kg, last menstrual period 05/14/2018. Maternal Exam:  Uterine Assessment: Contraction frequency is regular.   Abdomen: Patient reports no abdominal tenderness. Fundal height is appropriate for gestation.   Fetal presentation: vertex  Introitus: Normal vulva. Normal vagina.    Physical Exam  Constitutional: She is oriented to person, place, and time. She appears well-developed and well-nourished.  HENT:  Head: Normocephalic and atraumatic.  Cardiovascular: Normal rate and regular rhythm.  Respiratory: Effort normal and breath sounds normal. No respiratory distress. She has no wheezes.  GI: Soft. Bowel sounds are normal. She exhibits no distension. There is no abdominal tenderness.  Genitourinary:    Vulva normal.   Musculoskeletal: Normal range of motion.  Neurological: She is alert and oriented to person, place, and time.  Skin: Skin is warm and dry.  Psychiatric: She has a normal mood and affect. Her behavior is normal.    Prenatal labs: ABO, Rh: --/--/A POS, A POS Performed at [facility], [place]  [address] [place], [place] [address]  [phone](08/09 0455) Antibody: NEG (08/09 0455) Rubella: Immune (01/13 0000) RPR: Nonreactive (01/13 0000)  HBsAg: Negative (01/13 0000)  HIV: Non-reactive (01/13 0000)  GBS: Positive (07/17 0000)   Tdap 5/15 Hgb 12.3/Plt 241/Ur Cx neg/Chl neg/GC neg/Varicella immune/Hgb electro WNL/AFP WNL/ glucola 87/  US incomplete anat, post plac US to complete normal anat   Assessment/Plan: 23yo G1P0 at 40+ in labor Admit to L&D PCN for GBBS prophylaxis Epidural prn Expect SVD   Maanvi Lecompte Bovard-Stuckert 02/19/2019, 7:43 AM

## 2019-02-19 NOTE — Anesthesia Postprocedure Evaluation (Signed)
Anesthesia Post Note  Patient: ADAIRA CENTOLA  Procedure(s) Performed: AN AD [place]     Patient location during evaluation: Mother Baby Anesthesia Type: Epidural Level of consciousness: awake and alert Pain management: pain level controlled Vital Signs Assessment: post-procedure vital signs reviewed and stable Respiratory status: spontaneous breathing, nonlabored ventilation and respiratory function stable Cardiovascular status: stable Postop Assessment: no headache, no backache and epidural receding Anesthetic complications: no    Last Vitals:  Vitals:   02/19/19 1453 02/19/19 1605  BP: (!) 107/58 106/68  Pulse: 68 64  Resp: 18 16  Temp: 36.7 C 36.7 C  SpO2: 100% 100%    Last Pain:  Vitals:   02/19/19 1605  TempSrc: Oral  PainSc: 0-No pain   Pain Goal: Patients Stated Pain Goal: 7 (02/19/19 0600)                 Jarell Mcewen

## 2019-02-19 NOTE — Progress Notes (Signed)
Patient ID: Yesenia Joseph, female   DOB: [birth year], 23 y.o.   MRN: [id number]  Contractions difficult to trace, placed IUPC  FHT 110-120's, mod var, + accels, category 1 toco irr  IUPC placed w/o diff/comp  Continue current mgmt

## 2019-02-19 NOTE — Progress Notes (Signed)
Patient ID: Yesenia Joseph, female   DOB: 1995/08/13, 24 y.o.   MRN: [id number]  Late entry  Pt C/C/+2-3 with pressure  Will start pushing

## 2019-02-19 NOTE — Anesthesia Procedure Notes (Signed)
Epidural Patient location during procedure: OB  Staffing Anesthesiologist: Deshannon Seide, MD Performed: anesthesiologist   Preanesthetic Checklist Completed: patient identified, site marked, surgical consent, pre-op evaluation, timeout performed, IV checked, risks and benefits discussed and monitors and equipment checked  Epidural Patient position: sitting Prep: DuraPrep Patient monitoring: heart rate, continuous pulse ox and blood pressure Approach: right paramedian Location: L3-L4 Injection technique: LOR saline  Needle:  Needle type: Tuohy  Needle gauge: 17 G Needle length: 9 cm and 9 Needle insertion depth: 6 cm Catheter type: closed end flexible Catheter size: 20 Guage Catheter at skin depth: 10 cm Test dose: negative  Assessment Events: blood not aspirated, injection not painful, no injection resistance, negative IV test and no paresthesia  Additional Notes Patient identified. Risks/Benefits/Options discussed with patient including but not limited to bleeding, infection, nerve damage, paralysis, failed block, incomplete pain control, headache, blood pressure changes, nausea, vomiting, reactions to medication both or allergic, itching and postpartum back pain. Confirmed with bedside nurse the patient's most recent platelet count. Confirmed with patient that they are not currently taking any anticoagulation, have any bleeding history or any family history of bleeding disorders. Patient expressed understanding and wished to proceed. All questions were answered. Sterile technique was used throughout the entire procedure. Please see nursing notes for vital signs. Test dose was given through epidural needle and negative prior to continuing to dose epidural or start infusion. Warning signs of high block given to the patient including shortness of breath, tingling/numbness in hands, complete motor block, or any concerning symptoms with instructions to call for help. Patient was given  instructions on fall risk and not to get out of bed. All questions and concerns addressed with instructions to call with any issues.     

## 2019-02-20 ENCOUNTER — Inpatient Hospital Stay (HOSPITAL_COMMUNITY): Payer: BC Managed Care – PPO

## 2019-02-20 LAB — CBC
HCT: 35.1 % — ABNORMAL LOW (ref 36.0–46.0)
Hemoglobin: 11.2 g/dL — ABNORMAL LOW (ref 12.0–15.0)
MCH: 28 pg (ref 26.0–34.0)
MCHC: 31.9 g/dL (ref 30.0–36.0)
MCV: 87.8 fL (ref 80.0–100.0)
Platelets: 187 10*3/uL (ref 150–400)
RBC: 4 MIL/uL (ref 3.87–5.11)
RDW: 13.8 % (ref 11.5–15.5)
WBC: 14.7 10*3/uL — ABNORMAL HIGH (ref 4.0–10.5)
nRBC: 0 % (ref 0.0–0.2)

## 2019-02-20 NOTE — Progress Notes (Signed)
Patient ID: Yesenia Joseph, female   DOB: 10-Aug-1995, 23 y.o.   MRN: [id number] PPD#1 Pt doing well. Pain well controlled. Ambulating, voiding and tolerating diet well. Lochia mild. Denies HA/blurry vision/CP or SOB. She is bonding well with baby; breastfeeding. Considering discharge to home later today - not sure yet VSS GEN - NAD ABD - FF EXT - no homans  14.7> 11.2<187  A/P: PPD#1 s/p svd after spontaneous labor; first baby - stable         Routine pp care         Possible discharge to home later today

## 2019-02-20 NOTE — Lactation Note (Signed)
This note was copied from a baby's chart. Lactation Consultation Note  Patient Name: Yesenia Joseph HYIFO'Y Date: 02/20/2019 Reason for consult: Follow-up assessment;Primapara;1st time breastfeeding;Term  Mom called out for help with latching.   Term baby 23 hrs old and has been latching fairly well per Mom, but at times is sleepy.  Baby dropped 5% Baby fussy and not wanting to latch.  Baby placed STS on Mom's chest and burped a loud burp.  Baby settled down. Baby latched deeply in football hold on left breast.  Rhythmic sucking and swallowing identified.  Basics reviewed. Mom taught to use breast compression during sucking.  Encouraged continued STS and feeding baby often with cues. Mom appreciative and has no questions currently.   Encouraged to call prn for assistance.   Feeding Feeding Type: Breast Fed  LATCH Score Latch: Grasps breast easily, tongue down, lips flanged, rhythmical sucking.  Audible Swallowing: Spontaneous and intermittent  Type of Nipple: Everted at rest and after stimulation  Comfort (Breast/Nipple): Soft / non-tender  Hold (Positioning): Assistance needed to correctly position infant at breast and maintain latch.  LATCH Score: 9  Interventions Interventions: Breast feeding basics reviewed;Assisted with latch;Skin to skin;Breast massage;Hand express;Breast compression;Adjust position;Position options;Support pillows   Consult Status Consult Status: Follow-up Date: 02/21/19 Follow-up type: [place] 02/20/2019, 4:41 PM

## 2019-02-21 MED ORDER — ACETAMINOPHEN 325 MG PO TABS: 650.0000 mg | ORAL_TABLET | ORAL | 0 refills | Status: DC | PRN

## 2019-02-21 MED ORDER — IBUPROFEN 600 MG PO TABS: 600.0000 mg | ORAL_TABLET | Freq: Four times a day (QID) | ORAL | 0 refills | Status: DC

## 2019-02-21 NOTE — Discharge Summary (Signed)
OB Discharge Summary     Patient Name: Yesenia Joseph DOB: 05-29-1996 MRN: [id number]  Date of admission: 02/19/2019 Delivering MD: Janyth Contes   Date of discharge: 02/21/2019  Admitting diagnosis: 68 WKS, CTX Intrauterine pregnancy: [redacted]w[redacted]d     Secondary diagnosis:  Principal Problem:   SVD (spontaneous vaginal delivery) Active Problems:   Pregnancy  Additional problems: none     Discharge diagnosis: Term Pregnancy Delivered                                                                                                Post partum procedures:none  Augmentation: AROM  Complications: None  Hospital course:  Onset of Labor With Vaginal Delivery     23 y.o. yo G1P0 at [redacted]w[redacted]d was admitted in Active Labor on 02/19/2019. Patient had an uncomplicated labor course as follows:  Membrane Rupture Time/Date:  ,   Intrapartum Procedures: Episiotomy: None [1]                                         Lacerations:    Patient had a delivery of a Viable infant. 02/19/2019  Information for the patient's newborn:  Ahmira, Boisselle [[id number]]  Delivery Method: Vag-Spont(filed from delivery)     Pateint had an uncomplicated postpartum course.  She is ambulating, tolerating a regular diet, passing flatus, and urinating well. Patient is discharged home in stable condition on 02/21/19.   Physical exam  Vitals:   02/20/19 0450 02/20/19 1530 02/20/19 2145 02/21/19 0633  BP: (!) 91/56 110/67 108/63 111/64  Pulse: 72 70 70 64  Resp: 16 17 18 18   Temp: 98.3 F (36.8 C) 98.4 F (36.9 C) 98.1 F (36.7 C) 97.7 F (36.5 C)  TempSrc: Oral Oral Oral Oral  SpO2:    99%  Weight:      Height:       General: alert and cooperative Lochia: appropriate Uterine Fundus: firm  Labs: Lab Results  Component Value Date   WBC 14.7 (H) 02/20/2019   HGB 11.2 (L) 02/20/2019   HCT 35.1 (L) 02/20/2019   MCV 87.8 02/20/2019   PLT 187 02/20/2019   CMP Latest Ref Rng & Units 12/11/2014  Glucose 70 -  99 mg/dL 64(L)  BUN 6 - 23 mg/dL 12  Creatinine 0.50 - 1.10 mg/dL 0.90  Sodium 135 - 145 mEq/L 138  Potassium 3.5 - 5.3 mEq/L 3.9  Chloride 96 - 112 mEq/L 103  CO2 19 - 32 mEq/L 26  Calcium 8.4 - 10.5 mg/dL 8.4  Total Protein 6.0 - 8.3 g/dL 6.0  Total Bilirubin 0.2 - 1.2 mg/dL 0.5  Alkaline Phos 39 - 117 U/L 51  AST 0 - 37 U/L 22  ALT 0 - 35 U/L 19    Discharge instruction: per After Visit Summary and "Baby and Me Booklet".  After visit meds:  Allergies as of 02/21/2019      Reactions   Anesthetics, Amide Nausea And Vomiting   Anesthetics, Ester Nausea And Vomiting  Anesthetics, Halogenated Nausea And Vomiting   Codeine Nausea And Vomiting      Medication List    TAKE these medications   acetaminophen 325 MG tablet Commonly known as: Tylenol Take 2 tablets (650 mg total) by mouth every 4 (four) hours as needed (for pain scale < 4).   ibuprofen 600 MG tablet Commonly known as: ADVIL Take 1 tablet (600 mg total) by mouth every 6 (six) hours.   lisdexamfetamine 50 MG capsule Commonly known as: VYVANSE Take 50 mg by mouth every morning.   multivitamin-prenatal 27-0.8 MG Tabs tablet Take 1 tablet by mouth daily at 12 noon.   vitamin C 100 MG tablet Take 100 mg by mouth daily.       Diet: routine diet  Activity: Advance as tolerated. Pelvic rest for 6 weeks.   Outpatient follow up:6 weeks Follow up Appt:No future appointments. Follow up Visit:No follow-ups on file.  Postpartum contraception: Undecided  Newborn Data: Live born female  Birth Weight: 6 lb 15.6 oz (3164 g) APGAR: 8, 9  Newborn Delivery   Birth date/time: 02/19/2019 13:09:00 Delivery type: Vaginal, Spontaneous      Baby Feeding: Breast Disposition:home with mother   02/21/2019 Oliver PilaKathy W Vaudie Engebretsen, MD

## 2019-02-21 NOTE — Lactation Note (Signed)
This note was copied from a baby's chart. Lactation Consultation Note  Patient Name: Yesenia Joseph IDPOE'U Date: 02/21/2019  Parents called me, as [name] latched to the breast. I observed the feeding. [name] had a great latch with frequent swallows (swallows verified by cervical auscultation).   Dr. Marvel Plan into room.  [name] [facility] 02/21/2019, 9:48 AM

## 2019-02-21 NOTE — Lactation Note (Signed)
This note was copied from a baby's chart. Lactation Consultation Note  Patient Name: Yesenia Joseph JTTSV'X Date: 02/21/2019   Mom w/hx of PCOS & hyperthyroidism. Mom reports + breast changes w/pregnancy. No stool over the last 32 hrs.   Parents report a difficult time getting infant to eat overnight b/c of sleepiness. When I entered room at the beginning of the consult, Mom had spent the last 45 minutes getting infant to latch, without success. "[name]" did begin showing feeding cues, but when attempting to latch, she would only get mom's nipple and suck a few times.   Mom's R nipple is atraumatic. Mom's L nipple has some minute areas of mild scabbing versus tiny blisters. Hand expression was taught to Mom & the resulting EBM (1 ml) was spoon/finger-fed to infant. Mom was set up with her own personal pump; size 21 flanges were attempted briefly, but then moved to size 24 flanges.   Pumping was interrupted b/c Mom was feeling emotional. This LC will return.    [name] [facility] 02/21/2019, 9:25 AM

## 2019-02-21 NOTE — Progress Notes (Signed)
Post Partum Day 2 Subjective: no complaints and tolerating PO  Objective: Blood pressure 111/64, pulse 64, temperature 97.7 F (36.5 C), temperature source Oral, resp. rate 18, height 5\' 2"  (1.575 m), weight 97 kg, last menstrual period 05/14/2018, SpO2 99 %.  Physical Exam:  General: alert and cooperative Lochia: appropriate Uterine Fundus: firm   Recent Labs    02/19/19 0455 02/20/19 0522  HGB 12.0 11.2*  HCT 37.3 35.1*    Assessment/Plan: Discharge home   LOS: 2 days   Logan Bores 02/21/2019, 10:22 AM

## 2019-02-21 NOTE — Lactation Note (Signed)
This note was copied from a baby's chart. Lactation Consultation Note  Patient Name: Yesenia Joseph Date: 02/21/2019    Specifics of an asymmetric latch were shown via [employer]. I showed Dad how to assist with the teacup hold.   Parents & this LC very pleased with this most recent feeding. Parents know how to work with [name] if she is too sleepy to get a good latch. Parents know how to reach [place] after discharge if there are any questions.  Mom's L nipple now appears more atraumatic.  [name] [facility] 02/21/2019, 10:00 AM

## 2019-05-13 ENCOUNTER — Encounter (HOSPITAL_COMMUNITY): Payer: Self-pay

## 2019-07-17 DIAGNOSIS — N6489 Other specified disorders of breast: Secondary | ICD-10-CM | POA: Diagnosis not present

## 2019-07-17 DIAGNOSIS — O9123 Nonpurulent mastitis associated with lactation: Secondary | ICD-10-CM | POA: Diagnosis not present

## 2019-07-24 ENCOUNTER — Telehealth: Payer: Self-pay | Admitting: *Deleted

## 2019-07-24 NOTE — Telephone Encounter (Signed)
Pt called states she had a toenail removed in 01/2019 and it is red, swollen with a lump to the side and is very painful to walk.

## 2019-07-24 NOTE — Telephone Encounter (Signed)
I spoke with pt she states she fell and damaged the toenail and the doctor removed the entire toenail, now the right toe is painful on the right side and red, swollen, and has a lump on the right side. I told pt that on occasion there can be a light regrowth and would need to be removed. I instructed pt to begin 1/4 cup epsom salt to 1 quart warm water soaking twice daily and apply antibiotic ointment to the area until seen in office. Transferred pt to schedulers.

## 2019-07-27 ENCOUNTER — Encounter: Payer: Self-pay | Admitting: Podiatry

## 2019-07-27 ENCOUNTER — Other Ambulatory Visit: Payer: Self-pay

## 2019-07-27 ENCOUNTER — Ambulatory Visit (INDEPENDENT_AMBULATORY_CARE_PROVIDER_SITE_OTHER): Payer: BC Managed Care – PPO | Admitting: Podiatry

## 2019-07-27 DIAGNOSIS — L600 Ingrowing nail: Secondary | ICD-10-CM | POA: Diagnosis not present

## 2019-07-27 MED ORDER — NEOMYCIN-POLYMYXIN-HC 3.5-10000-1 OT SOLN: OTIC | 1 refills | Status: DC

## 2019-07-27 NOTE — Patient Instructions (Signed)

## 2019-07-28 NOTE — Progress Notes (Signed)
Subjective:   Patient ID: Yesenia Joseph, female   DOB: 24 y.o.   MRN: [id number]   HPI Patient states her right big toenail has been sore in the corner and it grew back okay after last year but it is tender neuro   ROS      Objective:  Physical Exam  Vascular status intact with patient's lateral border right hallux found to be incurvated and tender with inflammation but no drainage or redness noted     Assessment:  Ingrown toenail deformity right hallux lateral border with pain     Plan:  H&P condition reviewed and I recommended correction of the nail deformity.  She wants this done I explained procedure and allowed her to sign consent form understanding risk.  Today I infiltrated the right hallux 60 mg Xylocaine Marcaine mixture sterile prep applied and using sterile instrumentation I remove that the lateral border exposed matrix and applied phenol 3 applications 30 seconds followed by alcohol lavage sterile dressing.  Give instructions on soaks and reappoint and encouraged to call with questions and wrote prescription for drops

## 2019-08-08 ENCOUNTER — Encounter: Payer: Self-pay | Admitting: Podiatry

## 2020-01-02 DIAGNOSIS — N920 Excessive and frequent menstruation with regular cycle: Secondary | ICD-10-CM | POA: Diagnosis not present

## 2020-01-17 DIAGNOSIS — N926 Irregular menstruation, unspecified: Secondary | ICD-10-CM | POA: Diagnosis not present

## 2020-02-22 ENCOUNTER — Other Ambulatory Visit: Payer: Self-pay

## 2020-02-22 ENCOUNTER — Ambulatory Visit: Payer: BC Managed Care – PPO | Admitting: Nurse Practitioner

## 2020-02-22 ENCOUNTER — Encounter: Payer: Self-pay | Admitting: Nurse Practitioner

## 2020-02-22 VITALS — BP 120/78 | HR 95 | Temp 97.9°F | Resp 18 | Ht 62.0 in | Wt 174.4 lb

## 2020-02-22 DIAGNOSIS — Z0001 Encounter for general adult medical examination with abnormal findings: Secondary | ICD-10-CM

## 2020-02-22 DIAGNOSIS — Z6832 Body mass index (BMI) 32.0-32.9, adult: Secondary | ICD-10-CM

## 2020-02-22 DIAGNOSIS — Z7689 Persons encountering health services in other specified circumstances: Secondary | ICD-10-CM | POA: Diagnosis not present

## 2020-02-22 DIAGNOSIS — E282 Polycystic ovarian syndrome: Secondary | ICD-10-CM | POA: Diagnosis not present

## 2020-02-22 DIAGNOSIS — Z1159 Encounter for screening for other viral diseases: Secondary | ICD-10-CM | POA: Diagnosis not present

## 2020-02-22 DIAGNOSIS — Z13228 Encounter for screening for other metabolic disorders: Secondary | ICD-10-CM

## 2020-02-22 DIAGNOSIS — Z13220 Encounter for screening for lipoid disorders: Secondary | ICD-10-CM

## 2020-02-22 NOTE — Progress Notes (Signed)
New Patient Office Visit  Subjective:  Patient ID: Yesenia Joseph, female    DOB: 08/01/1995  Age: 24 y.o. MRN: [id number]  CC:  Chief Complaint  Patient presents with  . Establish Care    NP    HPI Yesenia Joseph is a 43 year ols female pt presenting to establish care and general adult examination with a h/o PCOS with GYN she completed apt in July with tsh, t4, a1c  another in July will bring results. She reports trying to eat healthy and 5 days per week of exercise at least 20 minutes daily. No cp, ct, gu/gi sxs, pain, sob, edema, or falls.    Past Medical History:  Diagnosis Date  . Complication of anesthesia   . Hyperthyroidism   . PCOS (polycystic ovarian syndrome)   . PONV (postoperative nausea and vomiting)   . Pre-diabetes   . SVD (spontaneous vaginal delivery) 02/19/2019    Past Surgical History:  Procedure Laterality Date  . TONSILLECTOMY    . WISDOM TOOTH EXTRACTION      Family History  Problem Relation Age of Onset  . Diabetes Other   . Hypertension Other   . Hyperlipidemia Other   . Cancer Maternal Grandfather   . Cancer Paternal Grandmother   . Diabetes Paternal Grandfather     Social History   Socioeconomic History  . Marital status: Single    Spouse name: Not on file  . Number of children: 0  . Years of education: Not on file  . Highest education level: Not on file  Occupational History  . Not on file  Tobacco Use  . Smoking status: Never Smoker  . Smokeless tobacco: Never Used  Vaping Use  . Vaping Use: Never used  Substance and Sexual Activity  . Alcohol use: No    Alcohol/week: 0.0 standard drinks  . Drug use: No  . Sexual activity: Yes  Other Topics Concern  . Not on file  Social History Narrative  . Not on file   Social Determinants of Health   Financial Resource Strain:   . Difficulty of Paying Living Expenses:   Food Insecurity:   . Worried About Programme researcher, broadcasting/film/video in the Last Year:   . Barista in the Last Year:     Transportation Needs:   . Freight forwarder (Medical):   Marland Kitchen Lack of Transportation (Non-Medical):   Physical Activity:   . Days of Exercise per Week:   . Minutes of Exercise per Session:   Stress:   . Feeling of Stress :   Social Connections:   . Frequency of Communication with Friends and Family:   . Frequency of Social Gatherings with Friends and Family:   . Attends Religious Services:   . Active Member of Clubs or Organizations:   . Attends Banker Meetings:   Marland Kitchen Marital Status:   Intimate Partner Violence:   . Fear of Current or Ex-Partner:   . Emotionally Abused:   Marland Kitchen Physically Abused:   . Sexually Abused:     ROS Review of Systems  All other systems reviewed and are negative.   Objective:   Today's Vitals: BP 120/78 (BP Location: Left Arm, Patient Position: Sitting, Cuff Size: Normal)   Pulse 95   Temp 97.9 F (36.6 C) (Temporal)   Resp 18   Ht 5\' 2"  (1.575 m)   Wt 174 lb 6.4 oz (79.1 kg)   LMP 02/17/2020   SpO2 99%  BMI 31.90 kg/m   Physical Exam Vitals and nursing note reviewed.  Constitutional:      General: She is not in acute distress.    Appearance: Normal appearance. She is well-developed and well-groomed. She is not ill-appearing.  HENT:     Head: Normocephalic.     Right Ear: Hearing and external ear normal.     Left Ear: Hearing and external ear normal.     Nose: Nose normal. No congestion or rhinorrhea.     Mouth/Throat:     Lips: Pink.     Mouth: Mucous membranes are moist.     Tongue: No lesions. Tongue does not deviate from midline.     Pharynx: Oropharynx is clear. Uvula midline. No oropharyngeal exudate or posterior oropharyngeal erythema.  Eyes:     General: Lids are normal. Lids are everted, no foreign bodies appreciated.     Extraocular Movements: Extraocular movements intact.     Conjunctiva/sclera: Conjunctivae normal.     Pupils: Pupils are equal, round, and reactive to light.  Neck:     Thyroid: No  thyromegaly or thyroid tenderness.     Vascular: No carotid bruit or JVD.  Cardiovascular:     Rate and Rhythm: Normal rate and regular rhythm.     Pulses: Normal pulses.     Heart sounds: Normal heart sounds, S1 normal and S2 normal.  Pulmonary:     Effort: Pulmonary effort is normal.     Breath sounds: Normal breath sounds.  Abdominal:     General: Abdomen is flat. Bowel sounds are normal.     Palpations: Abdomen is soft.  Musculoskeletal:        General: Normal range of motion.     Right shoulder: Normal.     Left shoulder: Normal.     Cervical back: Normal, full passive range of motion without pain, normal range of motion and neck supple.     Thoracic back: Normal.     Lumbar back: Normal.     Right lower leg: Normal. No edema.     Left lower leg: Normal. No edema.     Right ankle: Normal.     Left ankle: Normal.  Lymphadenopathy:     Head:     Right side of head: No submental, submandibular or tonsillar adenopathy.     Left side of head: No submental, submandibular or tonsillar adenopathy.     Cervical: No cervical adenopathy.     Upper Body:     Right upper body: No supraclavicular adenopathy.     Left upper body: No supraclavicular adenopathy.  Skin:    General: Skin is warm and dry.     Capillary Refill: Capillary refill takes less than 2 seconds.  Neurological:     General: No focal deficit present.     Mental Status: She is alert and oriented to person, place, and time.  Psychiatric:        Attention and Perception: Attention and perception normal.        Mood and Affect: Mood normal.        Speech: Speech normal.        Behavior: Behavior normal. Behavior is cooperative.        Thought Content: Thought content normal.        Cognition and Memory: Cognition normal.        Judgment: Judgment normal.     Assessment & Plan:   Problem List Items Addressed This Visit  Endocrine   PCOS (polycystic ovarian syndrome)   Relevant Orders   Lipid panel    COMPLETE METABOLIC PANEL WITH GFR   CBC with Differential/Platelet     Other   BMI 32.0-32.9,adult   Relevant Orders   Lipid panel   COMPLETE METABOLIC PANEL WITH GFR   CBC with Differential/Platelet   VITAMIN D 25 Hydroxy (Vit-D Deficiency, Fractures)    Other Visit Diagnoses    Encounter to establish care    -  Primary   Relevant Orders   Lipid panel   COMPLETE METABOLIC PANEL WITH GFR   CBC with Differential/Platelet   VITAMIN D 25 Hydroxy (Vit-D Deficiency, Fractures)   Hepatitis C antibody   Need for hepatitis C screening test       Relevant Orders   Hepatitis C antibody   Screening for metabolic disorder       Relevant Orders   COMPLETE METABOLIC PANEL WITH GFR   Lipid screening         Follow a low fat/low cholesterol, low carbohydrate, low sugar diet, restrict 2,000 gram sodium per day diet  Get at least 20 minutes of exercise at least 4 times per week  Blood pressure goals 140/90 or less seek medical attention for out of this range  Consider COVID vaccine  UTD on tetanus vaccine  Keep GYN appts yearly.   Labs to be completed today.   Outpatient Encounter Medications as of 02/22/2020  Medication Sig  . acetaminophen (TYLENOL) 325 MG tablet Take 2 tablets (650 mg total) by mouth every 4 (four) hours as needed (for pain scale < 4).  . Ascorbic Acid (VITAMIN C) 100 MG tablet Take 100 mg by mouth daily.  Marland Kitchen ibuprofen (ADVIL) 600 MG tablet Take 1 tablet (600 mg total) by mouth every 6 (six) hours.  Marland Kitchen lisdexamfetamine (VYVANSE) 50 MG capsule Take 50 mg by mouth every morning.  . neomycin-polymyxin-hydrocortisone (CORTISPORIN) OTIC solution Apply 1-2 drops to toe after soaking BID  . Prenatal Vit-Fe Fumarate-FA (MULTIVITAMIN-PRENATAL) 27-0.8 MG TABS tablet Take 1 tablet by mouth daily at 12 noon.   No facility-administered encounter medications on file as of 02/22/2020.    Follow-up: Return in about 1 year (around 02/21/2021).   Elmore Guise, FNP

## 2020-02-22 NOTE — Patient Instructions (Signed)
Follow a low fat/low cholesterol, low carbohydrate, low sugar diet, restrict 2,000 gram sodium per day diet  Get at least 20 minutes of exercise at least 4 times per week  Blood pressure goals 140/90 or less seek medical attention for out of this range 

## 2020-02-23 LAB — CBC WITH DIFFERENTIAL/PLATELET
Absolute Monocytes: 543 cells/uL (ref 200–950)
Basophils Absolute: 41 cells/uL (ref 0–200)
Basophils Relative: 0.7 %
Eosinophils Absolute: 83 cells/uL (ref 15–500)
Eosinophils Relative: 1.4 %
HCT: 41.9 % (ref 35.0–45.0)
Hemoglobin: 14 g/dL (ref 11.7–15.5)
Lymphs Abs: 2136 cells/uL (ref 850–3900)
MCH: 28.7 pg (ref 27.0–33.0)
MCHC: 33.4 g/dL (ref 32.0–36.0)
MCV: 85.9 fL (ref 80.0–100.0)
MPV: 11 fL (ref 7.5–12.5)
Monocytes Relative: 9.2 %
Neutro Abs: 3098 cells/uL (ref 1500–7800)
Neutrophils Relative %: 52.5 %
Platelets: 272 10*3/uL (ref 140–400)
RBC: 4.88 10*6/uL (ref 3.80–5.10)
RDW: 12.3 % (ref 11.0–15.0)
Total Lymphocyte: 36.2 %
WBC: 5.9 10*3/uL (ref 3.8–10.8)

## 2020-02-23 LAB — COMPLETE METABOLIC PANEL WITH GFR
AG Ratio: 2.1 (calc) (ref 1.0–2.5)
ALT: 21 U/L (ref 6–29)
AST: 21 U/L (ref 10–30)
Albumin: 4.4 g/dL (ref 3.6–5.1)
Alkaline phosphatase (APISO): 65 U/L (ref 31–125)
BUN: 12 mg/dL (ref 7–25)
CO2: 27 mmol/L (ref 20–32)
Calcium: 9.2 mg/dL (ref 8.6–10.2)
Chloride: 104 mmol/L (ref 98–110)
Creat: 0.79 mg/dL (ref 0.50–1.10)
GFR, Est African American: 120 mL/min/{1.73_m2} (ref >=60)
GFR, Est Non African American: 103 mL/min/{1.73_m2} (ref >=60)
Globulin: 2.1 g/dL (calc) (ref 1.9–3.7)
Glucose, Bld: 71 mg/dL (ref 65–99)
Potassium: 4.2 mmol/L (ref 3.5–5.3)
Sodium: 139 mmol/L (ref 135–146)
Total Bilirubin: 0.5 mg/dL (ref 0.2–1.2)
Total Protein: 6.5 g/dL (ref 6.1–8.1)

## 2020-02-23 LAB — LIPID PANEL
Cholesterol: 145 mg/dL (ref <200)
HDL: 51 mg/dL (ref >=50)
LDL Cholesterol (Calc): 78 mg/dL (calc)
Non-HDL Cholesterol (Calc): 94 mg/dL (calc) (ref <130)
Total CHOL/HDL Ratio: 2.8 (calc) (ref <5.0)
Triglycerides: 80 mg/dL (ref <150)

## 2020-02-23 LAB — HEPATITIS C ANTIBODY
Hepatitis C Ab: NONREACTIVE
SIGNAL TO CUT-OFF: 0.02 (ref <1.00)

## 2020-02-23 LAB — VITAMIN D 25 HYDROXY (VIT D DEFICIENCY, FRACTURES): Vit D, 25-Hydroxy: 39 ng/mL (ref 30–100)

## 2020-02-27 NOTE — Progress Notes (Signed)
Labs Looks great

## 2020-04-21 DIAGNOSIS — Z20822 Contact with and (suspected) exposure to covid-19: Secondary | ICD-10-CM | POA: Diagnosis not present

## 2020-04-28 DIAGNOSIS — Z20822 Contact with and (suspected) exposure to covid-19: Secondary | ICD-10-CM | POA: Diagnosis not present

## 2020-05-02 DIAGNOSIS — N911 Secondary amenorrhea: Secondary | ICD-10-CM | POA: Diagnosis not present

## 2020-05-02 DIAGNOSIS — Z3201 Encounter for pregnancy test, result positive: Secondary | ICD-10-CM | POA: Diagnosis not present

## 2020-05-02 DIAGNOSIS — Z23 Encounter for immunization: Secondary | ICD-10-CM | POA: Diagnosis not present

## 2020-06-03 DIAGNOSIS — O26891 Other specified pregnancy related conditions, first trimester: Secondary | ICD-10-CM | POA: Diagnosis not present

## 2020-06-03 DIAGNOSIS — Z3481 Encounter for supervision of other normal pregnancy, first trimester: Secondary | ICD-10-CM | POA: Diagnosis not present

## 2020-06-03 DIAGNOSIS — Z113 Encounter for screening for infections with a predominantly sexual mode of transmission: Secondary | ICD-10-CM | POA: Diagnosis not present

## 2020-06-03 DIAGNOSIS — Z3A11 11 weeks gestation of pregnancy: Secondary | ICD-10-CM | POA: Diagnosis not present

## 2020-06-03 DIAGNOSIS — Z369 Encounter for antenatal screening, unspecified: Secondary | ICD-10-CM | POA: Diagnosis not present

## 2020-06-03 LAB — OB RESULTS CONSOLE GC/CHLAMYDIA
Chlamydia: NEGATIVE
Gonorrhea: NEGATIVE

## 2020-06-03 LAB — OB RESULTS CONSOLE HEPATITIS B SURFACE ANTIGEN: Hepatitis B Surface Ag: NEGATIVE

## 2020-06-03 LAB — OB RESULTS CONSOLE RPR: RPR: NONREACTIVE

## 2020-06-03 LAB — OB RESULTS CONSOLE VARICELLA ZOSTER ANTIBODY, IGG: Varicella: IMMUNE

## 2020-06-03 LAB — OB RESULTS CONSOLE HIV ANTIBODY (ROUTINE TESTING): HIV: NONREACTIVE

## 2020-06-03 LAB — OB RESULTS CONSOLE RUBELLA ANTIBODY, IGM: Rubella: IMMUNE

## 2020-07-13 NOTE — L&D Delivery Note (Signed)
Delivery Note Pt reached complete dilations and pushed well.  At 1:39 PM a healthy female was delivered via Vaginal, Spontaneous (Presentation: Right Occiput Anterior).  APGAR: 8, 9; weight  pending.   Placenta status: Spontaneous, Intact.  Cord: 3 vessels with the following complications: None.    Anesthesia: None Episiotomy: None Lacerations: None Suture Repair: n/a Est. Blood Loss (mL):   Baby was a little hypoxic in first few minutes but responded to blowby 02 and suctioning.    Mom to postpartum.  Baby to Couplet care / Skin to Skin.  Oliver Pila 12/06/2020, 2:04 PM

## 2020-08-01 DIAGNOSIS — Z3689 Encounter for other specified antenatal screening: Secondary | ICD-10-CM | POA: Diagnosis not present

## 2020-08-01 DIAGNOSIS — Z363 Encounter for antenatal screening for malformations: Secondary | ICD-10-CM | POA: Diagnosis not present

## 2020-08-01 DIAGNOSIS — Z3A20 20 weeks gestation of pregnancy: Secondary | ICD-10-CM | POA: Diagnosis not present

## 2020-08-26 DIAGNOSIS — Z362 Encounter for other antenatal screening follow-up: Secondary | ICD-10-CM | POA: Diagnosis not present

## 2020-08-26 DIAGNOSIS — Z3A23 23 weeks gestation of pregnancy: Secondary | ICD-10-CM | POA: Diagnosis not present

## 2020-08-26 DIAGNOSIS — O4442 Low lying placenta NOS or without hemorrhage, second trimester: Secondary | ICD-10-CM | POA: Diagnosis not present

## 2020-09-23 DIAGNOSIS — Z23 Encounter for immunization: Secondary | ICD-10-CM | POA: Diagnosis not present

## 2020-09-23 DIAGNOSIS — Z3689 Encounter for other specified antenatal screening: Secondary | ICD-10-CM | POA: Diagnosis not present

## 2020-10-15 ENCOUNTER — Encounter: Payer: Self-pay | Admitting: Emergency Medicine

## 2020-10-15 ENCOUNTER — Other Ambulatory Visit: Payer: Self-pay

## 2020-10-15 ENCOUNTER — Ambulatory Visit
Admission: EM | Admit: 2020-10-15 | Discharge: 2020-10-15 | Disposition: A | Discharge status: Home / Self Care | Payer: BC Managed Care – PPO | Attending: Internal Medicine | Admitting: Internal Medicine

## 2020-10-15 DIAGNOSIS — J0100 Acute maxillary sinusitis, unspecified: Secondary | ICD-10-CM | POA: Diagnosis not present

## 2020-10-15 MED ORDER — AZITHROMYCIN 500 MG PO TABS: 500.0000 mg | ORAL_TABLET | Freq: Every day | ORAL | 0 refills | Status: DC

## 2020-10-15 NOTE — ED Triage Notes (Signed)
Pt has had cold s/s but now has sinus pressure and pain in her teeth. Pt is [redacted] weeks pregnant.  Has been taking robitussin and tylenol

## 2020-10-15 NOTE — ED Provider Notes (Signed)
[facility]    CSN: [id number] Arrival date & time: 10/15/20  1507      History   Chief Complaint No chief complaint on file.   HPI Yesenia Joseph is a 25 y.o. female who is [redacted] weeks pregnant who started with a cold last week, and developed sinus pressure 2 days ago and her teeth aches. She stated symptoms with a cough. Her husband started the illness.  She started doing saline rinses yesterday and only did it one time and got green mucous. Has done a covid test at home and was negative.  Had a fever of 101 for 2 days. Denies HA or body aches other than sinus pressure over her face.  States he cough was worse and is a little better today.    Past Medical History:  Diagnosis Date  . Complication of anesthesia   . Hyperthyroidism   . PCOS (polycystic ovarian syndrome)   . PONV (postoperative nausea and vomiting)   . Pre-diabetes   . SVD (spontaneous vaginal delivery) 02/19/2019    Patient Active Problem List   Diagnosis Date Noted  . Pregnancy 02/19/2019  . SVD (spontaneous vaginal delivery) 02/19/2019  . Dysmenorrhea 06/28/2015  . PCOS (polycystic ovarian syndrome) 02/17/2013  . Menstrual irregularity 02/04/2013  . BMI 32.0-32.9,adult 02/04/2013    Past Surgical History:  Procedure Laterality Date  . TONSILLECTOMY    . WISDOM TOOTH EXTRACTION      OB History    Gravida  2   Para      Term      Preterm      AB      Living        SAB      IAB      Ectopic      Multiple      Live Births               Home Medications    Prior to Admission medications   Medication Sig Start Date End Date Taking? Authorizing Provider  azithromycin (ZITHROMAX) 500 MG tablet Take 1 tablet (500 mg total) by mouth daily. 10/15/20  Yes Rodriguez-Southworth, Nettie Elm, PA-C  acetaminophen (TYLENOL) 325 MG tablet Take 2 tablets (650 mg total) by mouth every 4 (four) hours as needed (for pain scale < 4). 02/21/19   Huel Cote, MD  Ascorbic Acid (VITAMIN  C) 100 MG tablet Take 100 mg by mouth daily.    [provider]  lisdexamfetamine (VYVANSE) 50 MG capsule Take 50 mg by mouth every morning.    [provider]  neomycin-polymyxin-hydrocortisone (CORTISPORIN) OTIC solution Apply 1-2 drops to toe after soaking BID 07/27/19   Lenn Sink, DPM  Prenatal Vit-Fe Fumarate-FA (MULTIVITAMIN-PRENATAL) 27-0.8 MG TABS tablet Take 1 tablet by mouth daily at 12 noon.    [provider]    Family History Family History  Problem Relation Age of Onset  . Diabetes Other   . Hypertension Other   . Hyperlipidemia Other   . Cancer Maternal Grandfather   . Cancer Paternal Grandmother   . Diabetes Paternal Grandfather     Social History Social History   Tobacco Use  . Smoking status: Never Smoker  . Smokeless tobacco: Never Used  Vaping Use  . Vaping Use: Never used  Substance Use Topics  . Alcohol use: No    Alcohol/week: 0.0 standard drinks  . Drug use: No     Allergies   Anesthetics, amide; Anesthetics, ester; Anesthetics, halogenated; and  Codeine   Review of Systems Review of Systems  Constitutional: Positive for chills, fatigue and fever. Negative for activity change, appetite change and diaphoresis.  HENT: Positive for congestion, postnasal drip, rhinorrhea, sinus pressure, sinus pain and sneezing. Negative for ear discharge, ear pain, sore throat and trouble swallowing.   Eyes: Negative for discharge.  Respiratory: Positive for cough and wheezing. Negative for chest tightness and shortness of breath.   Cardiovascular: Negative for chest pain.  Musculoskeletal: Negative for myalgias.  Skin: Negative for rash.  Neurological: Negative for headaches.  Hematological: Negative for adenopathy.     Physical Exam Triage Vital Signs ED Triage Vitals [10/15/20 1515]  Enc Vitals Group     BP      Pulse      Resp      Temp      Temp src      SpO2      Weight      Height      Head Circumference       Peak Flow      Pain Score 7     Pain Loc      Pain Edu?      Excl. in GC?    No data found.  Updated Vital Signs BP 103/68 (BP Location: Right Arm)   Pulse 87   Temp 98.1 F (36.7 C) (Oral)   Resp 17   LMP 02/17/2020   SpO2 95%  Her pulse ox was repeated without her mask and was 98% Visual Acuity Right Eye Distance:   Left Eye Distance:   Bilateral Distance:    Right Eye Near:   Left Eye Near:    Bilateral Near:     Physical Exam Vitals and nursing note reviewed.  Constitutional:      General: She is not in acute distress.    Appearance: She is not toxic-appearing.  HENT:     Head: Normocephalic.     Right Ear: Tympanic membrane, ear canal and external ear normal.     Left Ear: Tympanic membrane, ear canal and external ear normal.     Nose: Congestion present.     Comments: Has tender maxillary sinus on ....    Mouth/Throat:     Mouth: Mucous membranes are moist.     Pharynx: Oropharynx is clear.  Eyes:     General: No scleral icterus.    Conjunctiva/sclera: Conjunctivae normal.  Cardiovascular:     Rate and Rhythm: Normal rate and regular rhythm.     Heart sounds: No murmur heard.   Pulmonary:     Effort: Pulmonary effort is normal.     Breath sounds: Normal breath sounds.     Comments: She had a faint wheeze on RUL, but after she coughed it resolved Musculoskeletal:        General: Normal range of motion.     Cervical back: Neck supple.  Lymphadenopathy:     Cervical: No cervical adenopathy.  Skin:    General: Skin is warm and dry.     Findings: No rash.  Neurological:     Mental Status: She is alert and oriented to person, place, and time.  Psychiatric:        Mood and Affect: Mood normal.        Behavior: Behavior normal.        Thought Content: Thought content normal.        Judgment: Judgment normal.       UC Treatments / Results  Labs (all labs ordered are listed, but only abnormal results are displayed) Labs Reviewed - No data to  display  EKG   Radiology No results found.  Procedures Procedures (including critical care time)  Medications Ordered in UC Medications - No data to display  Initial Impression / Assessment and Plan / UC Course  I have reviewed the triage vital signs and the nursing notes. She was advised to do Netie Pot rinses bid x 5 days.  Maxillary sinusitis and possibly allergic  bronchitis.  I placed her on Zithromax as noted to cover possibly bacterial bronchitis as well.  Final Clinical Impressions(s) / UC Diagnoses   Final diagnoses:  Acute maxillary sinusitis, recurrence not specified   Discharge Instructions   None    ED Prescriptions    Medication Sig Dispense Auth. Provider   azithromycin (ZITHROMAX) 500 MG tablet Take 1 tablet (500 mg total) by mouth daily. 5 tablet Rodriguez-Southworth, Nettie Elm, PA-C     PDMP not reviewed this encounter.   Garey Ham, [place] 10/15/20 1647

## 2020-10-31 DIAGNOSIS — K8020 Calculus of gallbladder without cholecystitis without obstruction: Secondary | ICD-10-CM

## 2020-10-31 HISTORY — DX: Calculus of gallbladder without cholecystitis without obstruction: K80.20

## 2020-11-04 DIAGNOSIS — R1011 Right upper quadrant pain: Secondary | ICD-10-CM | POA: Diagnosis not present

## 2020-11-05 ENCOUNTER — Other Ambulatory Visit: Payer: Self-pay | Admitting: Obstetrics and Gynecology

## 2020-11-05 DIAGNOSIS — R1011 Right upper quadrant pain: Secondary | ICD-10-CM

## 2020-11-07 DIAGNOSIS — K8020 Calculus of gallbladder without cholecystitis without obstruction: Secondary | ICD-10-CM | POA: Diagnosis not present

## 2020-11-07 DIAGNOSIS — Z3A34 34 weeks gestation of pregnancy: Secondary | ICD-10-CM | POA: Diagnosis not present

## 2020-11-07 DIAGNOSIS — O99613 Diseases of the digestive system complicating pregnancy, third trimester: Secondary | ICD-10-CM | POA: Diagnosis not present

## 2020-11-11 ENCOUNTER — Other Ambulatory Visit: Payer: Self-pay

## 2020-11-14 ENCOUNTER — Encounter (HOSPITAL_COMMUNITY): Payer: Self-pay | Admitting: Obstetrics and Gynecology

## 2020-11-14 ENCOUNTER — Inpatient Hospital Stay (HOSPITAL_COMMUNITY): Payer: BC Managed Care – PPO

## 2020-11-14 ENCOUNTER — Other Ambulatory Visit: Payer: Self-pay

## 2020-11-14 ENCOUNTER — Inpatient Hospital Stay (HOSPITAL_COMMUNITY)
Admission: AD | Admit: 2020-11-14 | Discharge: 2020-11-15 | Disposition: A | Discharge status: Home / Self Care | Payer: BC Managed Care – PPO | Attending: Obstetrics and Gynecology | Admitting: Obstetrics and Gynecology

## 2020-11-14 DIAGNOSIS — Z885 Allergy status to narcotic agent status: Secondary | ICD-10-CM | POA: Insufficient documentation

## 2020-11-14 DIAGNOSIS — O99283 Endocrine, nutritional and metabolic diseases complicating pregnancy, third trimester: Secondary | ICD-10-CM | POA: Insufficient documentation

## 2020-11-14 DIAGNOSIS — E876 Hypokalemia: Secondary | ICD-10-CM | POA: Diagnosis not present

## 2020-11-14 DIAGNOSIS — Z3A35 35 weeks gestation of pregnancy: Secondary | ICD-10-CM | POA: Diagnosis not present

## 2020-11-14 DIAGNOSIS — R1011 Right upper quadrant pain: Secondary | ICD-10-CM

## 2020-11-14 DIAGNOSIS — K8020 Calculus of gallbladder without cholecystitis without obstruction: Secondary | ICD-10-CM | POA: Diagnosis not present

## 2020-11-14 DIAGNOSIS — K8010 Calculus of gallbladder with chronic cholecystitis without obstruction: Secondary | ICD-10-CM | POA: Insufficient documentation

## 2020-11-14 DIAGNOSIS — O99613 Diseases of the digestive system complicating pregnancy, third trimester: Secondary | ICD-10-CM | POA: Diagnosis not present

## 2020-11-14 DIAGNOSIS — R109 Unspecified abdominal pain: Secondary | ICD-10-CM | POA: Diagnosis not present

## 2020-11-14 LAB — CBC
HCT: 28.5 % — ABNORMAL LOW (ref 36.0–46.0)
Hemoglobin: 9.2 g/dL — ABNORMAL LOW (ref 12.0–15.0)
MCH: 28.8 pg (ref 26.0–34.0)
MCHC: 32.3 g/dL (ref 30.0–36.0)
MCV: 89.1 fL (ref 80.0–100.0)
Platelets: 138 10*3/uL — ABNORMAL LOW (ref 150–400)
RBC: 3.2 MIL/uL — ABNORMAL LOW (ref 3.87–5.11)
RDW: 14 % (ref 11.5–15.5)
WBC: 5.4 10*3/uL (ref 4.0–10.5)
nRBC: 0 % (ref 0.0–0.2)

## 2020-11-14 LAB — COMPREHENSIVE METABOLIC PANEL
ALT: 28 U/L (ref 0–44)
AST: 31 U/L (ref 15–41)
Albumin: 2.3 g/dL — ABNORMAL LOW (ref 3.5–5.0)
Alkaline Phosphatase: 74 U/L (ref 38–126)
Anion gap: 4 — ABNORMAL LOW (ref 5–15)
BUN: 7 mg/dL (ref 6–20)
CO2: 23 mmol/L (ref 22–32)
Calcium: 7.8 mg/dL — ABNORMAL LOW (ref 8.9–10.3)
Chloride: 109 mmol/L (ref 98–111)
Creatinine, Ser: 0.63 mg/dL (ref 0.44–1.00)
GFR, Estimated: >60 mL/min (ref >60)
Glucose, Bld: 101 mg/dL — ABNORMAL HIGH (ref 70–99)
Potassium: 2.9 mmol/L — ABNORMAL LOW (ref 3.5–5.1)
Sodium: 136 mmol/L (ref 135–145)
Total Bilirubin: 0.4 mg/dL (ref 0.3–1.2)
Total Protein: 5.1 g/dL — ABNORMAL LOW (ref 6.5–8.1)

## 2020-11-14 LAB — LIPASE, BLOOD: Lipase: 29 U/L (ref 11–51)

## 2020-11-14 MED ORDER — POTASSIUM CHLORIDE CRYS ER 20 MEQ PO TBCR: 20.0000 meq | EXTENDED_RELEASE_TABLET | Freq: Every day | ORAL | 0 refills | Status: DC

## 2020-11-14 MED ORDER — TRAMADOL HCL 50 MG PO TABS: 50.0000 mg | ORAL_TABLET | Freq: Four times a day (QID) | ORAL | 0 refills | Status: DC | PRN

## 2020-11-14 NOTE — MAU Provider Note (Signed)
Chief Complaint:  No chief complaint on file.   Event Date/Time   First Provider Initiated Contact with Patient 11/14/20 2114     HPI: Yesenia Joseph is a 25 y.o. G2P1001 at 42w0dwho presents to maternity admissions reporting recurrent pain with known gallstone.  Has been diagnosed and has a consultation appt scheduled with surgeon.  No N/V.Marland Kitchen She reports good fetal movement, denies LOF, vaginal bleeding, vaginal itching/burning, urinary symptoms, h/a, dizziness, n/v, diarrhea, constipation or fever/chills.  She denies headache, visual changes or RUQ abdominal pain.  Abdominal Pain This is a recurrent problem. The current episode started today. The problem occurs intermittently. The problem has been unchanged. The pain is located in the epigastric region and RUQ. The abdominal pain radiates to the back. Pertinent negatives include no anorexia, constipation, diarrhea, dysuria, fever, frequency, headaches, myalgias, nausea or vomiting. Nothing aggravates the pain. The pain is relieved by nothing. She has tried nothing for the symptoms.    RN Note: Couple of wks ago I found out I have a gallstone. Have been in some pain since but tonight the pain is radiating to mid back and makes it hard to breathe, move, etc without worsening pain. Just wanted to be sure gallbladder is not infected, etc. No pregnancy concerns. Took Tylenol 500mg  at 0600 and then 12n and none since. Did not help  Past Medical History: Past Medical History:  Diagnosis Date  . Complication of anesthesia   . Gallstones 10/31/2020  . Hyperthyroidism   . PCOS (polycystic ovarian syndrome)   . PONV (postoperative nausea and vomiting)   . Pre-diabetes   . SVD (spontaneous vaginal delivery) 02/19/2019    Past obstetric history: OB History  Gravida Para Term Preterm AB Living  2 1 1     1   SAB IAB Ectopic Multiple Live Births          1    # Outcome Date GA Lbr Len/2nd Weight Sex Delivery Anes PTL Lv  2 Current           1 Term  02/19/19 [redacted]w[redacted]d   F Vag-Spont   LIV    Past Surgical History: Past Surgical History:  Procedure Laterality Date  . TONSILLECTOMY    . WISDOM TOOTH EXTRACTION      Family History: Family History  Problem Relation Age of Onset  . Diabetes Other   . Hypertension Other   . Hyperlipidemia Other   . Cancer Maternal Grandfather   . Cancer Paternal Grandmother   . Diabetes Paternal Grandfather     Social History: Social History   Tobacco Use  . Smoking status: Never Smoker  . Smokeless tobacco: Never Used  Vaping Use  . Vaping Use: Never used  Substance Use Topics  . Alcohol use: No    Alcohol/week: 0.0 standard drinks  . Drug use: No    Allergies:  Allergies  Allergen Reactions  . Anesthetics, Amide Nausea And Vomiting  . Anesthetics, Ester Nausea And Vomiting  . Anesthetics, Halogenated Nausea And Vomiting  . Codeine Nausea And Vomiting    Meds:  Medications Prior to Admission  Medication Sig Dispense Refill Last Dose  . acetaminophen (TYLENOL) 325 MG tablet Take 2 tablets (650 mg total) by mouth every 4 (four) hours as needed (for pain scale < 4). 30 tablet 0 11/14/2020 at 1200  . Ascorbic Acid (VITAMIN C) 100 MG tablet Take 100 mg by mouth daily.   11/14/2020 at Unknown time  . azithromycin (ZITHROMAX) 500 MG tablet Take  1 tablet (500 mg total) by mouth daily. 5 tablet 0 11/14/2020 at Unknown time  . Prenatal Vit-Fe Fumarate-FA (MULTIVITAMIN-PRENATAL) 27-0.8 MG TABS tablet Take 1 tablet by mouth daily at 12 noon.   11/14/2020 at Unknown time  . lisdexamfetamine (VYVANSE) 50 MG capsule Take 50 mg by mouth every morning.     . neomycin-polymyxin-hydrocortisone (CORTISPORIN) OTIC solution Apply 1-2 drops to toe after soaking BID 10 mL 1     I have reviewed patient's Past Medical Hx, Surgical Hx, Family Hx, Social Hx, medications and allergies.   ROS:  Review of Systems  Constitutional: Negative for fever.  Gastrointestinal: Positive for abdominal pain. Negative for  anorexia, constipation, diarrhea, nausea and vomiting.  Genitourinary: Negative for dysuria and frequency.  Musculoskeletal: Negative for myalgias.  Neurological: Negative for headaches.   Other systems negative  Physical Exam   Patient Vitals for the past 24 hrs:  BP Temp Pulse Resp SpO2 Height Weight  11/14/20 2030 -- -- -- -- 99 % -- --  11/14/20 2027 108/60 -- (!) 113 -- -- -- --  11/14/20 2026 -- 97.9 F (36.6 C) -- 18 -- 5\' 2"  (1.575 m) 89.8 kg   Constitutional: Well-developed, well-nourished female in no acute distress.  Cardiovascular: normal rate and rhythm Respiratory: normal effort, clear to auscultation bilaterally GI: Abd soft, tender over RUQ, gravid appropriate for gestational age.   No rebound or guarding. MS: Extremities nontender, no edema, normal ROM Neurologic: Alert and oriented x 4.  GU: Neg CVAT.  PELVIC EXAM: deferred  FHT:  Baseline 140 , moderate variability, accelerations present, no decelerations Contractions: Rare   Labs: Results for orders placed or performed during the hospital encounter of 11/14/20 (from the past 24 hour(s))  CBC     Status: Abnormal   Collection Time: 11/14/20  9:38 PM  Result Value Ref Range   WBC 5.4 4.0 - 10.5 K/uL   RBC 3.20 (L) 3.87 - 5.11 MIL/uL   Hemoglobin 9.2 (L) 12.0 - 15.0 g/dL   HCT 01/14/21 (L) 29.7 - 98.9 %   MCV 89.1 80.0 - 100.0 fL   MCH 28.8 26.0 - 34.0 pg   MCHC 32.3 30.0 - 36.0 g/dL   RDW 21.1 94.1 - 74.0 %   Platelets 138 (L) 150 - 400 K/uL   nRBC 0.0 0.0 - 0.2 %  Comprehensive metabolic panel     Status: Abnormal   Collection Time: 11/14/20  9:38 PM  Result Value Ref Range   Sodium 136 135 - 145 mmol/L   Potassium 2.9 (L) 3.5 - 5.1 mmol/L   Chloride 109 98 - 111 mmol/L   CO2 23 22 - 32 mmol/L   Glucose, Bld 101 (H) 70 - 99 mg/dL   BUN 7 6 - 20 mg/dL   Creatinine, Ser 01/14/21 0.44 - 1.00 mg/dL   Calcium 7.8 (L) 8.9 - 10.3 mg/dL   Total Protein 5.1 (L) 6.5 - 8.1 g/dL   Albumin 2.3 (L) 3.5 - 5.0 g/dL    AST 31 15 - 41 U/L   ALT 28 0 - 44 U/L   Alkaline Phosphatase 74 38 - 126 U/L   Total Bilirubin 0.4 0.3 - 1.2 mg/dL   GFR, Estimated 4.81 >85 mL/min   Anion gap 4 (L) 5 - 15  Lipase, blood     Status: None   Collection Time: 11/14/20  9:38 PM  Result Value Ref Range   Lipase 29 11 - 51 U/L    Imaging:  01/14/21  ABDOMEN LIMITED RUQ (LIVER/GB)  Result Date: 11/14/2020 CLINICAL DATA:  Right upper quadrant pain, known gallstones EXAM: ULTRASOUND ABDOMEN LIMITED RIGHT UPPER QUADRANT COMPARISON:  CT 60 14 FINDINGS: Gallbladder: Mild gallbladder wall thickening, nonspecific given a non NPO status. However, there is a positive sonographic Murphy sign with visible gallstone measuring up to 1.8 cm in size. No pericholecystic fluid is seen. Common bile duct: Diameter: 3 mm, nondilated Liver: No focal lesion identified. Within normal limits in parenchymal echogenicity. Portal vein is patent on color Doppler imaging with normal direction of blood flow towards the liver. Other: Non-NPO status with continual intake of fluids and a tender at approximately 6:30 p.m. IMPRESSION: Cholelithiasis. Mildly thickened gallbladder wall, possibly related to a non NPO status however a positive sonographic Eulah Pont sign was noted by sonographer. Findings are equivocal but could reflect an acute cholecystitis in the appropriate clinical setting. If further imaging delineation is necessary, HIDA scan should be obtained. Electronically Signed   By: Kreg Shropshire M.D.   On: 11/14/2020 22:30     MAU Course/MDM: I have ordered labs and reviewed results. Transaminases normal   No leukocytosis.   [place] reviewed, mildly thickened GB wall ?related to food intake, equivocal. NST reviewed, reactive Consult Dr Alysia Penna with presentation, exam findings and test results. Since patient is not vomiting, not acutely ill, will discharge home to followup in office.  I advised her to call office and update them.  They may want to move up surgical  consult   Assessment: Single iup at [redacted]w[redacted]d Known cholelithiasis Equivocal cholecystitis without vomiting Mild hypokalemia  Plan: Discharge home Rx K-dur x 2 doses ( total) Cholecystitis precautions.  Labor precautions and fetal kick counts Follow up in Office for prenatal visits   Encouraged to return if she develops worsening of symptoms, increase in pain, fever, or other concerning symptoms.  Pt stable at time of discharge.  Wynelle Bourgeois CNM, MSN Certified Nurse-Midwife 11/14/2020 9:14 PM

## 2020-11-14 NOTE — Discharge Instructions (Signed)
Cholelithiasis  Cholelithiasis is a disease in which gallstones form in the gallbladder. The gallbladder is an organ that stores bile. Bile is a fluid that helps to digest fats. Gallstones begin as small crystals and can slowly grow into stones. They may cause no symptoms until they block the gallbladder duct, or cystic duct, when the gallbladder tightens (contracts) after food is eaten. This can cause pain and is known as a gallbladder attack, or biliary colic. There are two main types of gallstones:  Cholesterol stones. These are the most common type of gallstone. These stones are made of hardened cholesterol and are usually yellow-green in color. Cholesterol is a fat-like substance that is made in the liver.  Pigment stones. These are dark in color and are made of a red-yellow substance, called bilirubin,that forms when hemoglobin from red blood cells breaks down. What are the causes? This condition may be caused by an imbalance in the different parts that make bile. This can happen if the bile:  Has too much bilirubin. This can happen in certain blood diseases, such as sickle cell anemia.  Has too much cholesterol.  Does not have enough bile salts. These salts help the body absorb and digest fats. In some cases, this condition can also be caused by the gallbladder not emptying completely or often enough. This is common during pregnancy. What increases the risk? The following factors may make you more likely to develop this condition:  Being female.  Having multiple pregnancies. Health care providers sometimes advise removing diseased gallbladders before future pregnancies.  Eating a diet that is heavy in fried foods, fat, and refined carbohydrates, such as white bread and white rice.  Being obese.  Being older than age 40.  Using medicines that contain female hormones (estrogen) for a long time.  Losing weight quickly.  Having a family history of gallstones.  Having certain  medical problems, such as: ? Diabetes mellitus. ? Cystic fibrosis. ? Crohn's disease. ? Cirrhosis or other long-term (chronic) liver disease. ? Certain blood diseases, such as sickle cell anemia or leukemia. What are the signs or symptoms? In many cases, having gallstones causes no symptoms. When you have gallstones but do not have symptoms, you have silent gallstones. If a gallstone blocks your bile duct, it can cause a gallbladder attack. The main symptom of a gallbladder attack is sudden pain in the upper right part of the abdomen. The pain:  Usually comes at night or after eating.  Can last for one hour or more.  Can spread to your right shoulder, back, or chest.  Can feel like indigestion. This is discomfort, burning, or fullness in your upper abdomen. If the bile duct is blocked for more than a few hours, it can cause an infection or inflammation of your gallbladder (cholecystitis), liver, or pancreas. This can cause:  Nausea or vomiting.  Bloating.  Pain in your abdomen that lasts for 5 hours or longer.  Tenderness in your upper abdomen, often in the upper right section and under your rib cage.  Fever or chills.  Skin or the white parts of your eyes turning yellow (jaundice). This usually happens when a stone has blocked bile from passing through the common bile duct.  Dark urine or light-colored stools. How is this diagnosed? This condition may be diagnosed based on:  A physical exam.  Your medical history.  Ultrasound.  CT scan.  MRI. You may also have other tests, including:  Blood tests to check for signs of an   infection or inflammation.  Cholescintigraphy, or HIDA scan. This is a scan of your gallbladder and bile ducts (biliary system) using non-harmful radioactive material and special cameras that can see the radioactive material.  Endoscopic retrograde cholangiopancreatogram. This involves inserting a small tube with a camera on the end (endoscope)  through your mouth to look at bile ducts and check for blockages. How is this treated? Treatment for this condition depends on the severity of the condition. Silent gallstones do not need treatment. Treatment may be needed if a blockage causes a gallbladder attack or other symptoms. Treatment may include:  Home care, if symptoms are not severe. ? During a simple gallbladder attack, stop eating and drinking for 12-24 hours (except for water and clear liquids). This helps to "cool down" your gallbladder. After 1 or 2 days, you can start to eat a diet of simple or clear foods, such as broths and crackers. ? You may also need medicines for pain or nausea or both. ? If you have cholecystitis and an infection, you will need antibiotics.  A hospital stay, if needed for pain control or for cholecystitis with severe infection.  Cholecystectomy, or surgery to remove your gallbladder. This is the most common treatment if all other treatments have not worked.  Medicines to break up gallstones. These are most effective at treating small gallstones. Medicines may be used for up to 6-12 months.  Endoscopic retrograde cholangiopancreatogram. A small basket can be attached to the endoscope and used to capture and remove gallstones, mainly those that are in the common bile duct. Follow these instructions at home: Medicines  Take over-the-counter and prescription medicines only as told by your health care provider.  If you were prescribed an antibiotic medicine, take it as told by your health care provider. Do not stop taking the antibiotic even if you start to feel better.  Ask your health care provider if the medicine prescribed to you requires you to avoid driving or using machinery. Eating and drinking  Drink enough fluid to keep your urine pale yellow. This is important during a gallbladder attack. Water and clear liquids are preferred.  Follow a healthy diet. This includes: ? Reducing fatty foods,  such as fried food and foods high in cholesterol. ? Reducing refined carbohydrates, such as white bread and white rice. ? Eating more fiber. Aim for foods such as almonds, fruit, and beans. Alcohol use  If you drink alcohol: ? Limit how much you use to:  0-1 drink a day for nonpregnant women.  0-2 drinks a day for men. ? Be aware of how much alcohol is in your drink. In the [place] one drink equals one 12 oz bottle of beer (355 mL), one 5 oz glass of wine (148 mL), or one 1 oz glass of hard liquor (44 mL). General instructions  Do not use any products that contain nicotine or tobacco, such as cigarettes, e-cigarettes, and chewing tobacco. If you need help quitting, ask your health care provider.  Maintain a healthy weight.  Keep all follow-up visits as told by your health care provider. These may include consultations with a surgeon or specialist. This is important. Where to find more information  National Institute of Diabetes and Digestive and Kidney Diseases: www.niddk.nih.gov Contact a health care provider if:  You think you have had a gallbladder attack.  You have been diagnosed with silent gallstones and you develop pain in your abdomen or indigestion.  You begin to have attacks more often.  You have   dark urine or light-colored stools. Get help right away if:  You have pain from a gallbladder attack that lasts for more than 2 hours.  You have pain in your abdomen that lasts for more than 5 hours or is getting worse.  You have a fever or chills.  You have nausea and vomiting that do not go away.  You develop jaundice. Summary  Cholelithiasis is a disease in which gallstones form in the gallbladder.  This condition may be caused by an imbalance in the different parts that make bile. This can happen if your bile has too much bilirubin or cholesterol, or does not have enough bile salts.  Treatment for gallstones depends on the severity of the condition. Silent  gallstones do not need treatment.  If gallstones cause a gallbladder attack or other symptoms, treatment usually involves not eating or drinking anything. Treatment may also include pain medicines and antibiotics, and it sometimes includes a hospital stay.  Surgery to remove the gallbladder is common if all other treatments have not worked. This information is not intended to replace advice given to you by your health care provider. Make sure you discuss any questions you have with your health care provider. Document Revised: 05/22/2019 Document Reviewed: 05/22/2019 Elsevier Patient Education  2021 [employer].  

## 2020-11-14 NOTE — MAU Note (Addendum)
Couple of wks ago I found out I have a gallstone. Have been in some pain since but tonight the pain is radiating to mid back and makes it hard to breathe, move, etc without worsening pain. Just wanted to be sure gallbladder is not infected, etc. No pregnancy concerns. Took Tylenol 500mg  at 0600 and then 12n and none since. Did not help

## 2020-11-15 DIAGNOSIS — O99283 Endocrine, nutritional and metabolic diseases complicating pregnancy, third trimester: Secondary | ICD-10-CM | POA: Diagnosis not present

## 2020-11-15 DIAGNOSIS — O99613 Diseases of the digestive system complicating pregnancy, third trimester: Secondary | ICD-10-CM

## 2020-11-15 DIAGNOSIS — K8010 Calculus of gallbladder with chronic cholecystitis without obstruction: Secondary | ICD-10-CM

## 2020-11-15 DIAGNOSIS — Z3A35 35 weeks gestation of pregnancy: Secondary | ICD-10-CM

## 2020-11-15 DIAGNOSIS — E876 Hypokalemia: Secondary | ICD-10-CM | POA: Diagnosis not present

## 2020-11-27 DIAGNOSIS — Z3685 Encounter for antenatal screening for Streptococcus B: Secondary | ICD-10-CM | POA: Diagnosis not present

## 2020-11-27 LAB — OB RESULTS CONSOLE GBS: GBS: POSITIVE

## 2020-12-03 DIAGNOSIS — L509 Urticaria, unspecified: Secondary | ICD-10-CM | POA: Diagnosis not present

## 2020-12-05 ENCOUNTER — Other Ambulatory Visit: Payer: Self-pay | Admitting: Obstetrics and Gynecology

## 2020-12-05 DIAGNOSIS — K8010 Calculus of gallbladder with chronic cholecystitis without obstruction: Secondary | ICD-10-CM | POA: Diagnosis not present

## 2020-12-05 NOTE — H&P (View-Only) (Signed)
Yesenia Joseph is a 25 y.o. female presenting for scheduled IOL. +FM, denies VB, LOF, CTX  PNC c/b 1) Cholestasis - diagnosed based on elevated bile acids at 21.4 (drawn for atypical pruritis on backs of hands, neck, head)  2) Gallstones - referral placed to Gen surg  GBS pos, no PCN allergy. Resolved placenta previa  OB History    Gravida  2   Para  1   Term  1   Preterm      AB      Living  1     SAB      IAB      Ectopic      Multiple      Live Births  1          Past Medical History:  Diagnosis Date  . Complication of anesthesia   . Gallstones 10/31/2020  . Hyperthyroidism   . PCOS (polycystic ovarian syndrome)   . PONV (postoperative nausea and vomiting)   . Pre-diabetes   . SVD (spontaneous vaginal delivery) 02/19/2019   Past Surgical History:  Procedure Laterality Date  . TONSILLECTOMY    . WISDOM TOOTH EXTRACTION     Family History: family history includes Cancer in her maternal grandfather and paternal grandmother; Diabetes in her paternal grandfather and another family member; Hyperlipidemia in an other family member; Hypertension in an other family member. Social History:  reports that she has never smoked. She has never used smokeless tobacco. She reports that she does not drink alcohol and does not use drugs.     Maternal Diabetes: No1hr 64 Genetic Screening: Declined Maternal Ultrasounds/Referrals: Normal Fetal Ultrasounds or other Referrals:  None Maternal Substance Abuse:  No Significant Maternal Medications:  None Significant Maternal Lab Results:  Group B Strep positive Other Comments:  None  Review of Systems  Constitutional: Negative for chills and fever.  Respiratory: Negative for shortness of breath.   Cardiovascular: Negative for chest pain, palpitations and leg swelling.  Gastrointestinal: Negative for abdominal pain and vomiting.  Neurological: Negative for dizziness, weakness and headaches.  Psychiatric/Behavioral:  Negative for suicidal ideas.   Maternal Medical History:  Prenatal complications: Cholelithiasis.   No pre-eclampsia, preterm labor or substance abuse.   Prenatal Complications - Diabetes: none.      Last menstrual period 02/17/2020, unknown if currently breastfeeding.  Prenatal labs: ABO, Rh:  Apos Antibody:  neg Rubella:  imm RPR:   nr HBsAg:   neg HIV:   nr GBS:   POS  Assessment/Plan: This is a 25yo G2P1001 @ 38 0/7 by LMP c/w 11wk scan admitted for IOL for newly diagnosed cholestasis at term. GBS pos, no PCN allergy. CE 1/60/-2 in clinic, PV cytotec for ripening, plan for AROM and pitocin after PCN ppx.Pelvis proved to 6lb15oz   Yesenia Joseph 12/05/2020, 7:40 PM

## 2020-12-05 NOTE — H&P (Signed)
Yesenia Joseph is a 25 y.o. female presenting for scheduled IOL. +FM, denies VB, LOF, CTX  PNC c/b 1) Cholestasis - diagnosed based on elevated bile acids at 21.4 (drawn for atypical pruritis on backs of hands, neck, head)  2) Gallstones - referral placed to Gen surg  GBS pos, no PCN allergy. Resolved placenta previa  OB History    Gravida  2   Para  1   Term  1   Preterm      AB      Living  1     SAB      IAB      Ectopic      Multiple      Live Births  1          Past Medical History:  Diagnosis Date  . Complication of anesthesia   . Gallstones 10/31/2020  . Hyperthyroidism   . PCOS (polycystic ovarian syndrome)   . PONV (postoperative nausea and vomiting)   . Pre-diabetes   . SVD (spontaneous vaginal delivery) 02/19/2019   Past Surgical History:  Procedure Laterality Date  . TONSILLECTOMY    . WISDOM TOOTH EXTRACTION     Family History: family history includes Cancer in her maternal grandfather and paternal grandmother; Diabetes in her paternal grandfather and another family member; Hyperlipidemia in an other family member; Hypertension in an other family member. Social History:  reports that she has never smoked. She has never used smokeless tobacco. She reports that she does not drink alcohol and does not use drugs.     Maternal Diabetes: No1hr 99 Genetic Screening: Declined Maternal Ultrasounds/Referrals: Normal Fetal Ultrasounds or other Referrals:  None Maternal Substance Abuse:  No Significant Maternal Medications:  None Significant Maternal Lab Results:  Group B Strep positive Other Comments:  None  Review of Systems  Constitutional: Negative for chills and fever.  Respiratory: Negative for shortness of breath.   Cardiovascular: Negative for chest pain, palpitations and leg swelling.  Gastrointestinal: Negative for abdominal pain and vomiting.  Neurological: Negative for dizziness, weakness and headaches.  Psychiatric/Behavioral:  Negative for suicidal ideas.   Maternal Medical History:  Prenatal complications: Cholelithiasis.   No pre-eclampsia, preterm labor or substance abuse.   Prenatal Complications - Diabetes: none.      Last menstrual period 02/17/2020, unknown if currently breastfeeding.  Prenatal labs: ABO, Rh:  Apos Antibody:  neg Rubella:  imm RPR:   nr HBsAg:   neg HIV:   nr GBS:   POS  Assessment/Plan: This is a 25yo G2P1001 @ 38 0/7 by LMP c/w 11wk scan admitted for IOL for newly diagnosed cholestasis at term. GBS pos, no PCN allergy. CE 1/60/-2 in clinic, PV cytotec for ripening, plan for AROM and pitocin after PCN ppx.Pelvis proved to 6lb15oz   Yesenia Joseph 12/05/2020, 7:40 PM    

## 2020-12-06 ENCOUNTER — Inpatient Hospital Stay (HOSPITAL_COMMUNITY): Payer: BC Managed Care – PPO | Admitting: Anesthesiology

## 2020-12-06 ENCOUNTER — Inpatient Hospital Stay (HOSPITAL_COMMUNITY): Payer: BC Managed Care – PPO

## 2020-12-06 ENCOUNTER — Encounter (HOSPITAL_COMMUNITY): Payer: Self-pay | Admitting: Obstetrics and Gynecology

## 2020-12-06 ENCOUNTER — Inpatient Hospital Stay (HOSPITAL_COMMUNITY)
Admission: AD | Admit: 2020-12-06 | Discharge: 2020-12-07 | DRG: 806 | Disposition: A | Discharge status: Home / Self Care | Payer: BC Managed Care – PPO | Attending: Obstetrics and Gynecology | Admitting: Obstetrics and Gynecology

## 2020-12-06 ENCOUNTER — Other Ambulatory Visit: Payer: Self-pay

## 2020-12-06 DIAGNOSIS — O26613 Liver and biliary tract disorders in pregnancy, third trimester: Secondary | ICD-10-CM | POA: Diagnosis not present

## 2020-12-06 DIAGNOSIS — K831 Obstruction of bile duct: Secondary | ICD-10-CM | POA: Diagnosis not present

## 2020-12-06 DIAGNOSIS — K8021 Calculus of gallbladder without cholecystitis with obstruction: Secondary | ICD-10-CM | POA: Diagnosis not present

## 2020-12-06 DIAGNOSIS — O2662 Liver and biliary tract disorders in childbirth: Secondary | ICD-10-CM | POA: Diagnosis not present

## 2020-12-06 DIAGNOSIS — Z20822 Contact with and (suspected) exposure to covid-19: Secondary | ICD-10-CM | POA: Diagnosis not present

## 2020-12-06 DIAGNOSIS — O99824 Streptococcus B carrier state complicating childbirth: Secondary | ICD-10-CM | POA: Diagnosis not present

## 2020-12-06 DIAGNOSIS — Z3A38 38 weeks gestation of pregnancy: Secondary | ICD-10-CM

## 2020-12-06 DIAGNOSIS — Z23 Encounter for immunization: Secondary | ICD-10-CM | POA: Diagnosis not present

## 2020-12-06 DIAGNOSIS — O99284 Endocrine, nutritional and metabolic diseases complicating childbirth: Secondary | ICD-10-CM | POA: Diagnosis not present

## 2020-12-06 DIAGNOSIS — E0590 Thyrotoxicosis, unspecified without thyrotoxic crisis or storm: Secondary | ICD-10-CM | POA: Diagnosis not present

## 2020-12-06 LAB — CBC
HCT: 31.4 % — ABNORMAL LOW (ref 36.0–46.0)
Hemoglobin: 10.3 g/dL — ABNORMAL LOW (ref 12.0–15.0)
MCH: 29.3 pg (ref 26.0–34.0)
MCHC: 32.8 g/dL (ref 30.0–36.0)
MCV: 89.5 fL (ref 80.0–100.0)
Platelets: 209 10*3/uL (ref 150–400)
RBC: 3.51 MIL/uL — ABNORMAL LOW (ref 3.87–5.11)
RDW: 15 % (ref 11.5–15.5)
WBC: 8.1 10*3/uL (ref 4.0–10.5)
nRBC: 0 % (ref 0.0–0.2)

## 2020-12-06 LAB — TYPE AND SCREEN
ABO/RH(D): A POS
Antibody Screen: NEGATIVE

## 2020-12-06 LAB — RESP PANEL BY RT-PCR (FLU A&B, COVID) ARPGX2
Influenza A by PCR: NEGATIVE
Influenza B by PCR: NEGATIVE
SARS Coronavirus 2 by RT PCR: NEGATIVE

## 2020-12-06 LAB — RPR: RPR Ser Ql: NONREACTIVE

## 2020-12-06 MED ORDER — SENNOSIDES-DOCUSATE SODIUM 8.6-50 MG PO TABS
2.0000 | ORAL_TABLET | ORAL | Status: DC
  Administered 2020-12-07: 2 via ORAL
  Filled 2020-12-06: qty 2

## 2020-12-06 MED ORDER — DIPHENHYDRAMINE HCL 25 MG PO CAPS: 25.0000 mg | ORAL_CAPSULE | Freq: Four times a day (QID) | ORAL | Status: DC | PRN

## 2020-12-06 MED ORDER — LACTATED RINGERS IV SOLN
500.0000 mL | Freq: Once | INTRAVENOUS | Status: AC
  Administered 2020-12-06: 500 mL via INTRAVENOUS

## 2020-12-06 MED ORDER — LACTATED RINGERS IV SOLN: INTRAVENOUS | Status: DC

## 2020-12-06 MED ORDER — SODIUM CHLORIDE 0.9 % IV SOLN
5.0000 10*6.[IU] | Freq: Once | INTRAVENOUS | Status: AC
  Administered 2020-12-06: 5 10*6.[IU] via INTRAVENOUS
  Filled 2020-12-06: qty 5

## 2020-12-06 MED ORDER — OXYTOCIN-SODIUM CHLORIDE 30-0.9 UT/500ML-% IV SOLN: 2.5000 [IU]/h | INTRAVENOUS | Status: DC

## 2020-12-06 MED ORDER — PRENATAL 27-0.8 MG PO TABS: 1.0000 | ORAL_TABLET | Freq: Every day | ORAL | Status: DC

## 2020-12-06 MED ORDER — SIMETHICONE 80 MG PO CHEW
80.0000 mg | CHEWABLE_TABLET | ORAL | Status: DC | PRN
Start: 2020-12-06 — End: 2020-12-07

## 2020-12-06 MED ORDER — ZOLPIDEM TARTRATE 5 MG PO TABS: 5.0000 mg | ORAL_TABLET | Freq: Every evening | ORAL | Status: DC | PRN

## 2020-12-06 MED ORDER — PRENATAL MULTIVITAMIN CH
1.0000 | ORAL_TABLET | Freq: Every day | ORAL | Status: DC
  Administered 2020-12-07: 1 via ORAL
  Filled 2020-12-06: qty 1

## 2020-12-06 MED ORDER — ACETAMINOPHEN 325 MG PO TABS: 650.0000 mg | ORAL_TABLET | ORAL | Status: DC | PRN

## 2020-12-06 MED ORDER — FENTANYL-BUPIVACAINE-NACL 0.5-0.125-0.9 MG/250ML-% EP SOLN
12.0000 mL/h | EPIDURAL | Status: DC | PRN
  Administered 2020-12-06: 12 mL/h via EPIDURAL
  Filled 2020-12-06: qty 250

## 2020-12-06 MED ORDER — OXYTOCIN-SODIUM CHLORIDE 30-0.9 UT/500ML-% IV SOLN
1.0000 m[IU]/min | INTRAVENOUS | Status: DC
  Administered 2020-12-06: 2 m[IU]/min via INTRAVENOUS
  Filled 2020-12-06: qty 500

## 2020-12-06 MED ORDER — OXYTOCIN BOLUS FROM INFUSION
333.0000 mL | Freq: Once | INTRAVENOUS | Status: AC
  Administered 2020-12-06: 333 mL via INTRAVENOUS

## 2020-12-06 MED ORDER — ONDANSETRON HCL 4 MG/2ML IJ SOLN
4.0000 mg | INTRAMUSCULAR | Status: DC | PRN
Start: 2020-12-06 — End: 2020-12-07

## 2020-12-06 MED ORDER — WITCH HAZEL-GLYCERIN EX PADS: 1.0000 "application " | MEDICATED_PAD | CUTANEOUS | Status: DC | PRN

## 2020-12-06 MED ORDER — MISOPROSTOL 25 MCG QUARTER TABLET
25.0000 ug | ORAL_TABLET | ORAL | Status: DC | PRN
  Administered 2020-12-06 (×2): 25 ug via VAGINAL
  Filled 2020-12-06 (×2): qty 1

## 2020-12-06 MED ORDER — LACTATED RINGERS IV SOLN: 500.0000 mL | INTRAVENOUS | Status: DC | PRN

## 2020-12-06 MED ORDER — SOD CITRATE-CITRIC ACID 500-334 MG/5ML PO SOLN: 30.0000 mL | ORAL | Status: DC | PRN

## 2020-12-06 MED ORDER — LIDOCAINE-EPINEPHRINE (PF) 2 %-1:200000 IJ SOLN
INTRAMUSCULAR | Status: DC | PRN
  Administered 2020-12-06: 4 mL via EPIDURAL

## 2020-12-06 MED ORDER — TETANUS-DIPHTH-ACELL PERTUSSIS 5-2.5-18.5 LF-MCG/0.5 IM SUSY: 0.5000 mL | PREFILLED_SYRINGE | Freq: Once | INTRAMUSCULAR | Status: DC

## 2020-12-06 MED ORDER — ONDANSETRON HCL 4 MG/2ML IJ SOLN: 4.0000 mg | Freq: Four times a day (QID) | INTRAMUSCULAR | Status: DC | PRN

## 2020-12-06 MED ORDER — PENICILLIN G POT IN DEXTROSE 60000 UNIT/ML IV SOLN
3.0000 10*6.[IU] | INTRAVENOUS | Status: DC
  Administered 2020-12-06 (×2): 3 10*6.[IU] via INTRAVENOUS
  Filled 2020-12-06 (×2): qty 50

## 2020-12-06 MED ORDER — PHENYLEPHRINE 40 MCG/ML (10ML) SYRINGE FOR IV PUSH (FOR BLOOD PRESSURE SUPPORT): 80.0000 ug | PREFILLED_SYRINGE | INTRAVENOUS | Status: DC | PRN

## 2020-12-06 MED ORDER — ONDANSETRON HCL 4 MG PO TABS: 4.0000 mg | ORAL_TABLET | ORAL | Status: DC | PRN

## 2020-12-06 MED ORDER — DIPHENHYDRAMINE HCL 50 MG/ML IJ SOLN: 12.5000 mg | INTRAMUSCULAR | Status: DC | PRN

## 2020-12-06 MED ORDER — BENZOCAINE-MENTHOL 20-0.5 % EX AERO: 1.0000 "application " | INHALATION_SPRAY | CUTANEOUS | Status: DC | PRN

## 2020-12-06 MED ORDER — COCONUT OIL OIL: 1.0000 "application " | TOPICAL_OIL | Status: DC | PRN

## 2020-12-06 MED ORDER — IBUPROFEN 600 MG PO TABS
600.0000 mg | ORAL_TABLET | Freq: Four times a day (QID) | ORAL | Status: DC
  Administered 2020-12-06 – 2020-12-07 (×4): 600 mg via ORAL
  Filled 2020-12-06 (×4): qty 1

## 2020-12-06 MED ORDER — TERBUTALINE SULFATE 1 MG/ML IJ SOLN: 0.2500 mg | Freq: Once | INTRAMUSCULAR | Status: DC | PRN

## 2020-12-06 MED ORDER — EPHEDRINE 5 MG/ML INJ: 10.0000 mg | INTRAVENOUS | Status: DC | PRN

## 2020-12-06 MED ORDER — LISDEXAMFETAMINE DIMESYLATE 50 MG PO CAPS: 50.0000 mg | ORAL_CAPSULE | Freq: Every morning | ORAL | Status: DC

## 2020-12-06 MED ORDER — DIBUCAINE (PERIANAL) 1 % EX OINT: 1.0000 "application " | TOPICAL_OINTMENT | CUTANEOUS | Status: DC | PRN

## 2020-12-06 NOTE — Interval H&P Note (Signed)
History and Physical Interval Note:  12/06/2020 9:21 AM Pt admitted and received cytotec x 2 with mild contractions overnight.  Itching not too bad.  FHR category 1  Cervix 60/2-3/-2 AROM clear  s/p PCN x 2 Starting pitocin, titrate until adequate Epidural prn   Oliver Pila

## 2020-12-06 NOTE — Lactation Note (Addendum)
This note was copied from a baby's chart. Lactation Consultation Note  Patient Name: Yesenia Joseph OVZCH'Y Date: 12/06/2020 Reason for consult: L&D Initial assessment Age:27 hours Infant STS on arrival.  Spitty.  Would not latch.  Showed parents how to rub some expressed breastmilk on her gums.  Left STS. Let parents know we would see them later on the floor.  [name] [name] 12/06/2020, 3:05 PM

## 2020-12-06 NOTE — Anesthesia Procedure Notes (Signed)
Epidural Patient location during procedure: OB Start time: 12/06/2020 10:25 AM End time: 12/06/2020 10:35 AM  Staffing Anesthesiologist: Elmer Picker, MD Performed: anesthesiologist   Preanesthetic Checklist Completed: patient identified, IV checked, risks and benefits discussed, monitors and equipment checked, pre-op evaluation and timeout performed  Epidural Patient position: sitting Prep: DuraPrep and site prepped and draped Patient monitoring: continuous pulse ox, blood pressure, heart rate and cardiac monitor Approach: midline Location: L3-L4 Injection technique: LOR air  Needle:  Needle type: Tuohy  Needle gauge: 17 G Needle length: 9 cm Needle insertion depth: 6 cm Catheter type: closed end flexible Catheter size: 19 Gauge Catheter at skin depth: 11 cm Test dose: negative  Assessment Sensory level: T8 Events: blood not aspirated, injection not painful, no injection resistance, no paresthesia and negative IV test  Additional Notes Patient identified. Risks/Benefits/Options discussed with patient including but not limited to bleeding, infection, nerve damage, paralysis, failed block, incomplete pain control, headache, blood pressure changes, nausea, vomiting, reactions to medication both or allergic, itching and postpartum back pain. Confirmed with bedside nurse the patient's most recent platelet count. Confirmed with patient that they are not currently taking any anticoagulation, have any bleeding history or any family history of bleeding disorders. Patient expressed understanding and wished to proceed. All questions were answered. Sterile technique was used throughout the entire procedure. Please see nursing notes for vital signs. Test dose was given through epidural catheter and negative prior to continuing to dose epidural or start infusion. Warning signs of high block given to the patient including shortness of breath, tingling/numbness in hands, complete motor block,  or any concerning symptoms with instructions to call for help. Patient was given instructions on fall risk and not to get out of bed. All questions and concerns addressed with instructions to call with any issues or inadequate analgesia.  Reason for block:procedure for pain

## 2020-12-06 NOTE — Anesthesia Preprocedure Evaluation (Signed)
Anesthesia Evaluation  Patient identified by MRN, date of birth, ID band Patient awake    Reviewed: Allergy & Precautions, NPO status , Patient's Chart, lab work & pertinent test results  History of Anesthesia Complications (+) PONV  Airway Mallampati: III  TM Distance: >3 FB Neck ROM: Full    Dental no notable dental hx.    Pulmonary neg pulmonary ROS,    Pulmonary exam normal breath sounds clear to auscultation       Cardiovascular negative cardio ROS Normal cardiovascular exam Rhythm:Regular Rate:Normal     Neuro/Psych negative neurological ROS  negative psych ROS   GI/Hepatic negative GI ROS, Neg liver ROS,   Endo/Other  Hyperthyroidism PCOS  Renal/GU negative Renal ROS  negative genitourinary   Musculoskeletal negative musculoskeletal ROS (+)   Abdominal   Peds  Hematology  (+) Blood dyscrasia (Hgb 10.3), anemia ,   Anesthesia Other Findings IOL for cholestasis  Reproductive/Obstetrics (+) Pregnancy                             Anesthesia Physical Anesthesia Plan  ASA: II  Anesthesia Plan: Epidural   Post-op Pain Management:    Induction:   PONV Risk Score and Plan: Treatment may vary due to age or medical condition  Airway Management Planned: Natural Airway  Additional Equipment:   Intra-op Plan:   Post-operative Plan:   Informed Consent: I have reviewed the patients History and Physical, chart, labs and discussed the procedure including the risks, benefits and alternatives for the proposed anesthesia with the patient or authorized representative who has indicated his/her understanding and acceptance.       Plan Discussed with: Anesthesiologist  Anesthesia Plan Comments: (Patient identified. Risks, benefits, options discussed with patient including but not limited to bleeding, infection, nerve damage, paralysis, failed block, incomplete pain control, headache,  blood pressure changes, nausea, vomiting, reactions to medication, itching, and post partum back pain. Confirmed with bedside nurse the patient's most recent platelet count. Confirmed with the patient that they are not taking any anticoagulation, have any bleeding history or any family history of bleeding disorders. Patient expressed understanding and wishes to proceed. All questions were answered. )        Anesthesia Quick Evaluation

## 2020-12-07 LAB — CBC
HCT: 31.3 % — ABNORMAL LOW (ref 36.0–46.0)
Hemoglobin: 10 g/dL — ABNORMAL LOW (ref 12.0–15.0)
MCH: 29 pg (ref 26.0–34.0)
MCHC: 31.9 g/dL (ref 30.0–36.0)
MCV: 90.7 fL (ref 80.0–100.0)
Platelets: 191 10*3/uL (ref 150–400)
RBC: 3.45 MIL/uL — ABNORMAL LOW (ref 3.87–5.11)
RDW: 14.9 % (ref 11.5–15.5)
WBC: 10 10*3/uL (ref 4.0–10.5)
nRBC: 0 % (ref 0.0–0.2)

## 2020-12-07 MED ORDER — IBUPROFEN 600 MG PO TABS: 600.0000 mg | ORAL_TABLET | Freq: Four times a day (QID) | ORAL | 0 refills | Status: DC

## 2020-12-07 NOTE — Discharge Summary (Signed)
Postpartum Discharge Summary       Patient Name: Yesenia Joseph DOB: [birth year] MRN: [id number]  Date of admission: 12/06/2020 Delivery date:12/06/2020  Delivering provider: Huel Cote  Date of discharge: 12/07/2020  Admitting diagnosis: Cholestasis during pregnancy in third trimester [O26.613, K83.1] NSVD (normal spontaneous vaginal delivery) [O80] Intrauterine pregnancy: [redacted]w[redacted]d     Secondary diagnosis:  Active Problems:   Cholestasis during pregnancy in third trimester   NSVD (normal spontaneous vaginal delivery)  Additional problems: Gallstones    Discharge diagnosis: Term Pregnancy Delivered                                              Post partum procedures:none Augmentation: AROM, Pitocin and Cytotec Complications: None  Hospital course: Induction of Labor With Vaginal Delivery   25 y.o. yo G2P2002 at [redacted]w[redacted]d was admitted to the hospital 12/06/2020 for induction of labor.  Indication for induction: Cholestasis of pregnancy.  Patient had an uncomplicated labor course as follows: Membrane Rupture Time/Date: 8:50 AM ,12/06/2020   Delivery Method:Vaginal, Spontaneous  Episiotomy: None  Lacerations:  None  Details of delivery can be found in separate delivery note.  Patient had a routine postpartum course. Patient is discharged home 12/07/20.  Newborn Data: Birth date:12/06/2020  Birth time:1:39 PM  Gender:Female  Living status:Living  Apgars:8 ,9  Weight:2931 g   Magnesium Sulfate received: No BMZ received: No Rhophylac:No   Physical exam  Vitals:   12/06/20 1752 12/06/20 2115 12/07/20 0150 12/07/20 0545  BP: 102/67 108/78 111/62 108/67  Pulse: 79 67 69 68  Resp: 18 16 16 18   Temp: 97.9 F (36.6 C) 97.8 F (36.6 C) 98.1 F (36.7 C) 98.1 F (36.7 C)  TempSrc: Axillary Oral Oral Oral  SpO2: 98% 98% 100% 100%  Weight:      Height:       General: alert and cooperative Lochia: appropriate Uterine Fundus: firm  Labs: Lab Results  Component Value  Date   WBC 10.0 12/07/2020   HGB 10.0 (L) 12/07/2020   HCT 31.3 (L) 12/07/2020   MCV 90.7 12/07/2020   PLT 191 12/07/2020   CMP Latest Ref Rng & Units 11/14/2020  Glucose 70 - 99 mg/dL 01/14/2021)  BUN 6 - 20 mg/dL 7  Creatinine 027(O - 5.36 mg/dL 6.44  Sodium 0.34 - 742 mmol/L 136  Potassium 3.5 - 5.1 mmol/L 2.9(L)  Chloride 98 - 111 mmol/L 109  CO2 22 - 32 mmol/L 23  Calcium 8.9 - 10.3 mg/dL 7.8(L)  Total Protein 6.5 - 8.1 g/dL 5.1(L)  Total Bilirubin 0.3 - 1.2 mg/dL 0.4  Alkaline Phos 38 - 126 U/L 74  AST 15 - 41 U/L 31  ALT 0 - 44 U/L 28   Edinburgh Score: Edinburgh Postnatal Depression Scale Screening Tool 12/06/2020  I have been able to laugh and see the funny side of things. (No Data)  I have looked forward with enjoyment to things. -  I have blamed myself unnecessarily when things went wrong. -  I have been anxious or worried for no good reason. -  I have felt scared or panicky for no good reason. -  Things have been getting on top of me. -  I have been so unhappy that I have had difficulty sleeping. -  I have felt sad or miserable. -  I have been so unhappy that I  have been crying. -  The thought of harming myself has occurred to me. Inocente Salles Postnatal Depression Scale Total -     After visit meds:  Allergies as of 12/07/2020      Reactions   Anesthetics, Amide Nausea And Vomiting   Anesthetics, Ester Nausea And Vomiting   Anesthetics, Halogenated Nausea And Vomiting   Codeine Nausea And Vomiting      Medication List    STOP taking these medications   azithromycin 500 MG tablet Commonly known as: ZITHROMAX   traMADol 50 MG tablet Commonly known as: ULTRAM     TAKE these medications   acetaminophen 325 MG tablet Commonly known as: Tylenol Take 2 tablets (650 mg total) by mouth every 4 (four) hours as needed (for pain scale < 4).   ibuprofen 600 MG tablet Commonly known as: ADVIL Take 1 tablet (600 mg total) by mouth every 6 (six) hours.    lisdexamfetamine 50 MG capsule Commonly known as: VYVANSE Take 50 mg by mouth every morning.   multivitamin-prenatal 27-0.8 MG Tabs tablet Take 1 tablet by mouth daily at 12 noon.   neomycin-polymyxin-hydrocortisone OTIC solution Commonly known as: CORTISPORIN Apply 1-2 drops to toe after soaking BID   potassium chloride SA 20 MEQ tablet Commonly known as: KLOR-CON Take 1 tablet (20 mEq total) by mouth daily.   vitamin C 100 MG tablet Take 100 mg by mouth daily.        Discharge home in stable condition Infant Feeding: Breast Infant Disposition:home with mother Discharge instruction: per After Visit Summary and Postpartum booklet. Activity: Advance as tolerated. Pelvic rest for 6 weeks.  Diet: routine diet Future Appointments:No future appointments. Follow up Visit:  Follow-up Information    Huel Cote, MD. Schedule an appointment as soon as possible for a visit in 5 week(s).   Specialty: Obstetrics and Gynecology Why: postpartum Contact information: [address] [place] [address] [phone]                Please schedule this patient for a In person postpartum visit in 6 weeks with the following provider: MD.  Delivery mode:  Vaginal, Spontaneous  Anticipated Birth Control:  Unsure   12/07/2020 Oliver Pila, MD

## 2020-12-07 NOTE — Progress Notes (Addendum)
Post Partum Day 1 Subjective: no complaints, up ad lib, voiding and tolerating PO  No itching since delivery.  Desires d/c home this PM.  Objective: Blood pressure 108/67, pulse 68, temperature 98.1 F (36.7 C), temperature source Oral, resp. rate 18, height 5\' 2"  (1.575 m), weight 93.4 kg, last menstrual period 02/17/2020, SpO2 100 %, unknown if currently breastfeeding.  Physical Exam:  General: alert and cooperative Lochia: appropriate Uterine Fundus: firm   Recent Labs    12/06/20 0030 12/07/20 0422  HGB 10.3* 10.0*  HCT 31.4* 31.3*    Assessment/Plan: Discharge home this PM if baby able to go   LOS: 1 day   12/09/20 12/07/2020, 12:19 PM

## 2020-12-07 NOTE — Anesthesia Postprocedure Evaluation (Signed)
Anesthesia Post Note  Patient: Yesenia Joseph  Procedure(s) Performed: AN AD HOC LABOR EPIDURAL     Patient location during evaluation: Mother Baby Anesthesia Type: Epidural Level of consciousness: awake and alert and oriented Pain management: satisfactory to patient Vital Signs Assessment: post-procedure vital signs reviewed and stable Respiratory status: respiratory function stable Cardiovascular status: stable Postop Assessment: no headache, no backache, epidural receding, patient able to bend at knees, no signs of nausea or vomiting, adequate PO intake and able to ambulate Anesthetic complications: no   No complications documented.  Last Vitals:  Vitals:   12/07/20 0150 12/07/20 0545  BP: 111/62 108/67  Pulse: 69 68  Resp: 16 18  Temp: 36.7 C 36.7 C  SpO2: 100% 100%    Last Pain:  Vitals:   12/07/20 0545  TempSrc: Oral  PainSc: 0-No pain   Pain Goal: Patients Stated Pain Goal: 3 (12/07/20 0001)                 Karleen Dolphin

## 2020-12-20 ENCOUNTER — Inpatient Hospital Stay (HOSPITAL_COMMUNITY)
Admission: AD | Admit: 2020-12-20 | Payer: BC Managed Care – PPO | Source: Home / Self Care | Admitting: Obstetrics & Gynecology

## 2020-12-20 ENCOUNTER — Inpatient Hospital Stay (HOSPITAL_COMMUNITY): Payer: BC Managed Care – PPO

## 2021-01-21 DIAGNOSIS — Z1389 Encounter for screening for other disorder: Secondary | ICD-10-CM | POA: Diagnosis not present

## 2021-01-21 DIAGNOSIS — Z124 Encounter for screening for malignant neoplasm of cervix: Secondary | ICD-10-CM | POA: Diagnosis not present

## 2021-01-21 DIAGNOSIS — Z3009 Encounter for other general counseling and advice on contraception: Secondary | ICD-10-CM | POA: Diagnosis not present

## 2021-01-31 DIAGNOSIS — K8010 Calculus of gallbladder with chronic cholecystitis without obstruction: Secondary | ICD-10-CM | POA: Diagnosis not present

## 2021-01-31 DIAGNOSIS — K811 Chronic cholecystitis: Secondary | ICD-10-CM | POA: Diagnosis not present

## 2021-06-07 ENCOUNTER — Ambulatory Visit
Admission: EM | Admit: 2021-06-07 | Discharge: 2021-06-07 | Disposition: A | Discharge status: Home / Self Care | Payer: BC Managed Care – PPO | Attending: Physician Assistant | Admitting: Physician Assistant

## 2021-06-07 DIAGNOSIS — R109 Unspecified abdominal pain: Secondary | ICD-10-CM | POA: Diagnosis not present

## 2021-06-07 LAB — POCT URINALYSIS DIP (MANUAL ENTRY)
Bilirubin, UA: NEGATIVE
Blood, UA: NEGATIVE
Glucose, UA: NEGATIVE mg/dL
Ketones, POC UA: NEGATIVE mg/dL
Leukocytes, UA: NEGATIVE
Nitrite, UA: NEGATIVE
Protein Ur, POC: NEGATIVE mg/dL
Spec Grav, UA: 1.015 (ref 1.010–1.025)
Urobilinogen, UA: 0.2 E.U./dL
pH, UA: 5.5 (ref 5.0–8.0)

## 2021-06-07 NOTE — ED Triage Notes (Signed)
Pt states that she has some abdominal pain, nausea, back pain and heart burn. 2 weeks  Pt had gallbladder removed in July.

## 2021-06-07 NOTE — ED Provider Notes (Signed)
[facility]    CSN: [id number] Arrival date & time: 06/07/21  1696      History   Chief Complaint Chief Complaint  Patient presents with   Abdominal Pain    Abdominal pain, nausea, and back pain. X2 weeks    HPI Yesenia Joseph is a 25 y.o. female.   Pt complains of lower abdominal pain after eating.  Pt reports she is normal until she eat asn then she develops cramping and discomfort.  Pt had her gallbladder removed in June.  Pt has tried otc medications without relief   The history is provided by the patient. No language interpreter was used.  Abdominal Pain Pain location:  Generalized Pain quality: aching   Pain radiates to:  Does not radiate Pain severity:  Moderate Onset quality:  Gradual Duration:  3 weeks Timing:  Intermittent Progression:  Worsening Ineffective treatments:  None tried Associated symptoms: no constipation and no fever   Risk factors: not pregnant    Past Medical History:  Diagnosis Date   Complication of anesthesia    Gallstones 10/31/2020   Hyperthyroidism    PCOS (polycystic ovarian syndrome)    PONV (postoperative nausea and vomiting)    Pre-diabetes    SVD (spontaneous vaginal delivery) 02/19/2019    Patient Active Problem List   Diagnosis Date Noted   Cholestasis during pregnancy in third trimester 12/06/2020   NSVD (normal spontaneous vaginal delivery) 12/06/2020   Pregnancy 02/19/2019   SVD (spontaneous vaginal delivery) 02/19/2019   Dysmenorrhea 06/28/2015   PCOS (polycystic ovarian syndrome) 02/17/2013   Menstrual irregularity 02/04/2013   BMI 32.0-32.9,adult 02/04/2013    Past Surgical History:  Procedure Laterality Date   CHOLECYSTECTOMY     TONSILLECTOMY     WISDOM TOOTH EXTRACTION      OB History     Gravida  2   Para  2   Term  2   Preterm      AB      Living  2      SAB      IAB      Ectopic      Multiple  0   Live Births  2            Home Medications    Prior to  Admission medications   Medication Sig Start Date End Date Taking? Authorizing Provider  acetaminophen (TYLENOL) 325 MG tablet Take 2 tablets (650 mg total) by mouth every 4 (four) hours as needed (for pain scale < 4). 02/21/19  Yes Huel Cote, MD  Ascorbic Acid (VITAMIN C) 100 MG tablet Take 100 mg by mouth daily.   Yes [provider]  ibuprofen (ADVIL) 600 MG tablet Take 1 tablet (600 mg total) by mouth every 6 (six) hours. 12/07/20   Huel Cote, MD  lisdexamfetamine (VYVANSE) 50 MG capsule Take 50 mg by mouth every morning.    [provider]  neomycin-polymyxin-hydrocortisone (CORTISPORIN) OTIC solution Apply 1-2 drops to toe after soaking BID 07/27/19   Regal, Kirstie Peri, DPM  potassium chloride SA (KLOR-CON) 20 MEQ tablet Take 1 tablet (20 mEq total) by mouth daily. 11/14/20   Aviva Signs, CNM  Prenatal Vit-Fe Fumarate-FA (MULTIVITAMIN-PRENATAL) 27-0.8 MG TABS tablet Take 1 tablet by mouth daily at 12 noon.    [provider]    Family History Family History  Problem Relation Age of Onset   Diabetes Other    Hypertension Other    Hyperlipidemia Other  Cancer Maternal Grandfather    Cancer Paternal Grandmother    Diabetes Paternal Grandfather     Social History Social History   Tobacco Use   Smoking status: Never   Smokeless tobacco: Never  Vaping Use   Vaping Use: Never used  Substance Use Topics   Alcohol use: No    Alcohol/week: 0.0 standard drinks   Drug use: No     Allergies   Anesthetics, amide; Anesthetics, ester; Anesthetics, halogenated; and Codeine   Review of Systems Review of Systems  Constitutional:  Negative for fever.  Gastrointestinal:  Positive for abdominal pain. Negative for constipation.  All other systems reviewed and are negative.   Physical Exam Triage Vital Signs ED Triage Vitals  Enc Vitals Group     BP 06/07/21 0857 114/75     Pulse Rate 06/07/21 0857 79     Resp 06/07/21 0857 20     Temp  06/07/21 0857 97.8 F (36.6 C)     Temp Source 06/07/21 0857 Temporal     SpO2 06/07/21 0857 96 %     Weight 06/07/21 0854 175 lb (79.4 kg)     Height 06/07/21 0854 5\' 2"  (1.575 m)     Head Circumference --      Peak Flow --      Pain Score 06/07/21 0854 8     Pain Loc --      Pain Edu? --      Excl. in GC? --    No data found.  Updated Vital Signs BP 114/75 (BP Location: Right Arm)   Pulse 79   Temp 97.8 F (36.6 C) (Temporal)   Resp 20   Ht 5\' 2"  (1.575 m)   Wt 79.4 kg   LMP 05/29/2021   SpO2 96%   BMI 32.01 kg/m   Visual Acuity Right Eye Distance:   Left Eye Distance:   Bilateral Distance:    Right Eye Near:   Left Eye Near:    Bilateral Near:     Physical Exam Vitals and nursing note reviewed.  Constitutional:      Appearance: She is well-developed.  HENT:     Head: Normocephalic.  Cardiovascular:     Rate and Rhythm: Normal rate and regular rhythm.  Pulmonary:     Effort: Pulmonary effort is normal.  Abdominal:     General: Bowel sounds are normal. There is no distension.     Palpations: Abdomen is soft.     Tenderness: There is generalized abdominal tenderness.  Musculoskeletal:        General: Normal range of motion.     Cervical back: Normal range of motion.  Neurological:     Mental Status: She is alert and oriented to person, place, and time.     UC Treatments / Results  Labs (all labs ordered are listed, but only abnormal results are displayed) Labs Reviewed  POCT URINALYSIS DIP (MANUAL ENTRY)    EKG   Radiology No results found.  Procedures Procedures (including critical care time)  Medications Ordered in UC Medications - No data to display  Initial Impression / Assessment and Plan / UC Course  I have reviewed the triage vital signs and the nursing notes.  Pertinent labs & imaging results that were available during my care of the patient were reviewed by me and considered in my medical decision making (see chart for  details).     MDM:  I advised shcedule to see Gi for evalaution  Final Clinical  Impressions(s) / UC Diagnoses   Final diagnoses:  Abdominal cramping     Discharge Instructions      Take pepcid daily.  Eat small frequent meals.    ED Prescriptions   None    PDMP not reviewed this encounter. An After Visit Summary was printed and given to the patient.    Elson Areas, [place] 06/07/21 1030

## 2021-06-07 NOTE — Discharge Instructions (Addendum)
Take pepcid daily.  Eat small frequent meals.

## 2021-06-11 ENCOUNTER — Ambulatory Visit (INDEPENDENT_AMBULATORY_CARE_PROVIDER_SITE_OTHER): Payer: BC Managed Care – PPO | Admitting: Gastroenterology

## 2021-06-11 ENCOUNTER — Encounter: Payer: Self-pay | Admitting: Gastroenterology

## 2021-06-11 ENCOUNTER — Other Ambulatory Visit (INDEPENDENT_AMBULATORY_CARE_PROVIDER_SITE_OTHER): Payer: BC Managed Care – PPO

## 2021-06-11 VITALS — BP 102/70 | HR 73 | Ht 62.0 in | Wt 175.5 lb

## 2021-06-11 DIAGNOSIS — R1033 Periumbilical pain: Secondary | ICD-10-CM

## 2021-06-11 LAB — COMPREHENSIVE METABOLIC PANEL
ALT: 19 U/L (ref 0–35)
AST: 19 U/L (ref 0–37)
Albumin: 4.5 g/dL (ref 3.5–5.2)
Alkaline Phosphatase: 69 U/L (ref 39–117)
BUN: 10 mg/dL (ref 6–23)
CO2: 28 mEq/L (ref 19–32)
Calcium: 9.4 mg/dL (ref 8.4–10.5)
Chloride: 104 mEq/L (ref 96–112)
Creatinine, Ser: 0.86 mg/dL (ref 0.40–1.20)
GFR: 92.41 mL/min (ref >60.00)
Glucose, Bld: 92 mg/dL (ref 70–99)
Potassium: 3.8 mEq/L (ref 3.5–5.1)
Sodium: 138 mEq/L (ref 135–145)
Total Bilirubin: 0.5 mg/dL (ref 0.2–1.2)
Total Protein: 6.9 g/dL (ref 6.0–8.3)

## 2021-06-11 LAB — CBC
HCT: 40.1 % (ref 36.0–46.0)
Hemoglobin: 13.8 g/dL (ref 12.0–15.0)
MCHC: 34.4 g/dL (ref 30.0–36.0)
MCV: 82.3 fl (ref 78.0–100.0)
Platelets: 234 10*3/uL (ref 150.0–400.0)
RBC: 4.87 Mil/uL (ref 3.87–5.11)
RDW: 13.3 % (ref 11.5–15.5)
WBC: 6.7 10*3/uL (ref 4.0–10.5)

## 2021-06-11 MED ORDER — HYOSCYAMINE SULFATE 0.125 MG SL SUBL: 0.1250 mg | SUBLINGUAL_TABLET | SUBLINGUAL | 2 refills | Status: DC | PRN

## 2021-06-11 NOTE — Patient Instructions (Signed)
If you are age 27 or older, your body mass index should be between 23-30. Your Body mass index is 32.1 kg/m. If this is out of the aforementioned range listed, please consider follow up with your Primary Care Provider.  If you are age 98 or younger, your body mass index should be between 19-25. Your Body mass index is 32.1 kg/m. If this is out of the aformentioned range listed, please consider follow up with your Primary Care Provider.   ________________________________________________________  The [facility] providers would like to encourage you to use [facility] to communicate with providers for non-urgent requests or questions.  Due to long hold times on the telephone, sending your provider a message by [facility] may be a faster and more efficient way to get a response.  Please allow 48 business hours for a response.  Please remember that this is for non-urgent requests.  _______________________________________________________  Your provider has requested that you go to the basement level for lab work before leaving today. Press "B" on the elevator. The lab is located at the first door on the left as you exit the elevator.  You have been scheduled for an abdominal ultrasound at [facility] (1st floor of hospital) on 06-18-21 at 10am. Please arrive 15 minutes prior to your appointment for registration. Make certain not to have anything to eat or drink after midnight prior to your appointment. Should you need to reschedule your appointment, please contact radiology at [phone]. This test typically takes about 30 minutes to perform.  Due to recent changes in healthcare laws, you may see the results of your imaging and laboratory studies on MyChart before your provider has had a chance to review them.  We understand that in some cases there may be results that are confusing or concerning to you. Not all laboratory results come back in the same time frame and the provider may be waiting for  multiple results in order to interpret others.  Please give [place] 48 hours in order for your provider to thoroughly review all the results before contacting the office for clarification of your results.   We have sent the following medications to your pharmacy for you to pick up at your convenience:  START: Levsin 0.125mg  sublingual (under the tongue)take 1 - 2 tablets every 4 hours as needed.  Thank you for entrusting me with your care and choosing [facility].  Dr Christella Hartigan

## 2021-06-11 NOTE — Progress Notes (Signed)
HPI: This is a very pleasant 25 year old woman who was referred to me by Elmore Guise, FNP  to evaluate abdominal cramping.    She tells me that she just had her period recently and there is no way that she could be pregnant.  She was having biliary colic type pains when she was pregnant and so shortly after delivery which was in late May 2022 she underwent what sounds like an uneventful laparoscopic cholecystectomy.  I am not able to see the operative report and I am not sure if she had an Intra-Op cholangiogram  She has done fine for months since that surgery and the delivery of her daughter.  She works as a Midwife.  Starting about 2 weeks ago she began to have nearly constant nausea and intermittent severe periumbilical for crampings.  The cramping was can last from 2 to 3 hours to all day.  They are often postprandial.  She has lost about 5 pounds in the past 2 or 3 weeks.  She has had no vomiting.  She tried proton pump inhibitors and they did not help even after several days.  She does not take NSAIDs.  Her bowels have really not been an issue for her however she does admit that her primary care provider recommended a stool softener and she took that and did have a BM and felt slightly better.  She does not feel constipated to be honest, she has a bowel movement every day or 2 and that is the way she has been for many years.  She does not see blood in her stool.  Old Data Reviewed:  She had her gallbladder removed laparoscopically for acute calculus cholecystitis in July 2022 by Dr. Sheliah Hatch of [facility] surgery.  I am unable to view the operative report.  Right upper quadrant ultrasound May 2022 done for right upper quadrant pain showed mild gallbladder wall thickening with a 1.8 cm gallstone within her gallbladder.  Common bile duct was normal nondilated.   Urinalysis 4 days ago was completely normal.    Review of systems: Pertinent positive and negative  review of systems were noted in the above HPI section. All other review negative.   Past Medical History:  Diagnosis Date   Complication of anesthesia    Gallstones 10/31/2020   Hyperthyroidism    PCOS (polycystic ovarian syndrome)    PONV (postoperative nausea and vomiting)    Pre-diabetes    SVD (spontaneous vaginal delivery) 02/19/2019    Past Surgical History:  Procedure Laterality Date   CHOLECYSTECTOMY     TONSILLECTOMY     WISDOM TOOTH EXTRACTION      Current Outpatient Medications  Medication Sig Dispense Refill   Multiple Vitamin (MULTIVITAMIN) tablet Take 1 tablet by mouth daily.     No current facility-administered medications for this visit.    Allergies as of 06/11/2021 - Review Complete 06/11/2021  Allergen Reaction Noted   Anesthetics, amide Nausea And Vomiting 12/12/2012   Anesthetics, ester Nausea And Vomiting 12/12/2012   Anesthetics, halogenated Nausea And Vomiting 12/12/2012   Codeine Nausea And Vomiting 01/31/2015    Family History  Problem Relation Age of Onset   Cancer Maternal Grandfather    Cancer Paternal Grandmother    Diabetes Paternal Grandfather    Diabetes Other    Hypertension Other    Hyperlipidemia Other    Colon cancer Neg Hx    Rectal cancer Neg Hx     Social History   Socioeconomic History  Marital status: Single    Spouse name: Alex   Number of children: 1   Years of education: Not on file   Highest education level: Not on file  Occupational History   Not on file  Tobacco Use   Smoking status: Never   Smokeless tobacco: Never  Vaping Use   Vaping Use: Never used  Substance and Sexual Activity   Alcohol use: No    Alcohol/week: 0.0 standard drinks   Drug use: No   Sexual activity: Yes  Other Topics Concern   Not on file  Social History Narrative   Not on file   Social Determinants of Health   Financial Resource Strain: Not on file  Food Insecurity: Not on file  Transportation Needs: Not on file  Physical  Activity: Not on file  Stress: Not on file  Social Connections: Not on file  Intimate Partner Violence: Not on file     Physical Exam: BP 102/70   Pulse 73   Ht 5\' 2"  (1.575 m)   Wt 175 lb 8 oz (79.6 kg)   LMP 05/29/2021   SpO2 98%   BMI 32.10 kg/m  Constitutional: generally well-appearing Psychiatric: alert and oriented x3 Eyes: extraocular movements intact Mouth: oral pharynx moist, no lesions Neck: supple no lymphadenopathy Cardiovascular: heart regular rate and rhythm Lungs: clear to auscultation bilaterally Abdomen: soft, nontender, nondistended, no obvious ascites, no peritoneal signs, normal bowel sounds Extremities: no lower extremity edema bilaterally Skin: no lesions on visible extremities   Assessment and plan: 25 y.o. female with nausea, periumbilical cramping, laparoscopic cholecystectomy several months ago  Really unclear etiology of her symptoms.  Perhaps retained gallstone could be playing a role.  Possibly new diagnosis of IBS but her symptoms seem pretty severe and certainly they were not of gradual onset.  I am calling in Levsin sublingual antispasmodic to see if that will help with her cramping.  She will get a basic set of labs today including a CBC complete metabolic profile and celiac sprue testing.  We will also get a full abdominal ultrasound.  She understands she might need further testing such as upper endoscopy depending on the results of the above.   Please see the "Patient Instructions" section for addition details about the plan.   34, MD [facility] 06/11/2021, 1:43 PM  Cc: 06/13/2021, Crystal A, FNP  Total time on date of encounter was 45 minutes (this included time spent preparing to see the patient reviewing records; obtaining and/or reviewing separately obtained history; performing a medically appropriate exam and/or evaluation; counseling and educating the patient and family if present; ordering medications, tests or  procedures if applicable; and documenting clinical information in the health record).

## 2021-06-12 LAB — IGA: Immunoglobulin A: 114 mg/dL (ref 47–310)

## 2021-06-12 LAB — TISSUE TRANSGLUTAMINASE, IGA: (tTG) Ab, IgA: <1 U/mL

## 2021-06-18 ENCOUNTER — Ambulatory Visit (HOSPITAL_COMMUNITY): Payer: BC Managed Care – PPO

## 2021-06-20 ENCOUNTER — Ambulatory Visit (HOSPITAL_COMMUNITY)
Admission: RE | Admit: 2021-06-20 | Discharge: 2021-06-20 | Disposition: A | Discharge status: Home / Self Care | Payer: BC Managed Care – PPO | Source: Ambulatory Visit | Attending: Gastroenterology | Admitting: Gastroenterology

## 2021-06-20 DIAGNOSIS — R1033 Periumbilical pain: Secondary | ICD-10-CM | POA: Insufficient documentation

## 2021-06-24 ENCOUNTER — Encounter: Payer: Self-pay | Admitting: Gastroenterology

## 2021-07-01 ENCOUNTER — Telehealth: Payer: Self-pay | Admitting: Gastroenterology

## 2021-07-01 DIAGNOSIS — R1033 Periumbilical pain: Secondary | ICD-10-CM

## 2021-07-01 NOTE — Telephone Encounter (Signed)
The pt was called with results on 12/9 and declined to make an appt for EGD at that time.  She has had a return of abd pain since and would like to set up the EGD.  I have scheduled her for 1/13 at 10 am in the [facility].  She was instructed over the phone and all information sent to her via My Chart. (Pt preference).  She will call if she has any questions or concerns.

## 2021-07-01 NOTE — Telephone Encounter (Signed)
Patient calling to give an update on her symptoms, states that they went away and they have come back Wants to know the next steps moving forward. Please advise.

## 2021-07-02 DIAGNOSIS — Z30430 Encounter for insertion of intrauterine contraceptive device: Secondary | ICD-10-CM | POA: Diagnosis not present

## 2021-07-18 ENCOUNTER — Encounter: Payer: Self-pay | Admitting: Gastroenterology

## 2021-07-18 ENCOUNTER — Telehealth: Payer: Self-pay | Admitting: Gastroenterology

## 2021-07-18 NOTE — Telephone Encounter (Signed)
Patient called and stated that she has not had abd pain in 3 to 4 week, but I still have the nausea 1 to 2 times a week. Seeking advice if Dr. Christella Hartigan wants her to have the EGD on 1/13. Please advise.

## 2021-07-18 NOTE — Telephone Encounter (Signed)
The pt has an appt for EGD on 1/13 already scheduled.  She says that her abd pain has resolved but she continues to have nausea several times a week.  She wants to know if she should keep the appt for EGD as planned. I advised she should keep the appt since she continues to have nausea frequently.  FYI Dr Christella Hartigan

## 2021-07-25 ENCOUNTER — Encounter: Payer: Self-pay | Admitting: Gastroenterology

## 2021-07-25 ENCOUNTER — Ambulatory Visit (AMBULATORY_SURGERY_CENTER): Payer: BC Managed Care – PPO | Admitting: Gastroenterology

## 2021-07-25 ENCOUNTER — Other Ambulatory Visit: Payer: Self-pay

## 2021-07-25 VITALS — BP 95/54 | HR 68 | Temp 97.5°F | Resp 11 | Ht 62.0 in | Wt 175.0 lb

## 2021-07-25 DIAGNOSIS — K2970 Gastritis, unspecified, without bleeding: Secondary | ICD-10-CM | POA: Diagnosis not present

## 2021-07-25 DIAGNOSIS — R1033 Periumbilical pain: Secondary | ICD-10-CM | POA: Diagnosis not present

## 2021-07-25 DIAGNOSIS — K2950 Unspecified chronic gastritis without bleeding: Secondary | ICD-10-CM | POA: Diagnosis not present

## 2021-07-25 MED ORDER — SODIUM CHLORIDE 0.9 % IV SOLN: 500.0000 mL | Freq: Once | INTRAVENOUS | Status: DC

## 2021-07-25 NOTE — Progress Notes (Signed)
Pt's states no medical or surgical changes since previsit or office visit. 

## 2021-07-25 NOTE — Patient Instructions (Signed)
YOU HAD AN ENDOSCOPIC PROCEDURE TODAY AT [facility]:   Refer to the procedure report that was given to you for any specific questions about what was found during the examination.  If the procedure report does not answer your questions, please call your gastroenterologist to clarify.  If you requested that your care partner not be given the details of your procedure findings, then the procedure report has been included in a sealed envelope for you to review at your convenience later.  **handout given on gastritis**  YOU SHOULD EXPECT: Some feelings of bloating in the abdomen. Passage of more gas than usual.  Walking can help get rid of the air that was put into your GI tract during the procedure and reduce the bloating. If you had a lower endoscopy (such as a colonoscopy or flexible sigmoidoscopy) you may notice spotting of blood in your stool or on the toilet paper. If you underwent a bowel prep for your procedure, you may not have a normal bowel movement for a few days.  Please Note:  You might notice some irritation and congestion in your nose or some drainage.  This is from the oxygen used during your procedure.  There is no need for concern and it should clear up in a day or so.  SYMPTOMS TO REPORT IMMEDIATELY:   Following upper endoscopy (EGD)  Vomiting of blood or coffee ground material  New chest pain or pain under the shoulder blades  Painful or persistently difficult swallowing  New shortness of breath  Fever of 100F or higher  Black, tarry-looking stools  For urgent or emergent issues, a gastroenterologist can be reached at any hour by calling [phone]. Do not use MyChart messaging for urgent concerns.    DIET:  We do recommend a small meal at first, but then you may proceed to your regular diet.  Drink plenty of fluids but you should avoid alcoholic beverages for 24 hours.  ACTIVITY:  You should plan to take it easy for the rest of today and you should NOT  DRIVE or use heavy machinery until tomorrow (because of the sedation medicines used during the test).    FOLLOW UP: Our staff will call the number listed on your records 48-72 hours following your procedure to check on you and address any questions or concerns that you may have regarding the information given to you following your procedure. If we do not reach you, we will leave a message.  We will attempt to reach you two times.  During this call, we will ask if you have developed any symptoms of COVID 19. If you develop any symptoms (ie: fever, flu-like symptoms, shortness of breath, cough etc.) before then, please call [phone].  If you test positive for Covid 19 in the 2 weeks post procedure, please call and report this information to [place].    If any biopsies were taken you will be contacted by phone or by letter within the next 1-3 weeks.  Please call us at [phone] if you have not heard about the biopsies in 3 weeks.    SIGNATURES/CONFIDENTIALITY: You and/or your care partner have signed paperwork which will be entered into your electronic medical record.  These signatures attest to the fact that that the information above on your After Visit Summary has been reviewed and is understood.  Full responsibility of the confidentiality of this discharge information lies with you and/or your care-partner.

## 2021-07-25 NOTE — Progress Notes (Signed)
Pt in recovery with monitors in place, VSS. Report given to receiving RN. Bite guard was placed with pt awake to ensure comfort. No dental or soft tissue damage noted. 

## 2021-07-25 NOTE — Op Note (Signed)
[facility] Patient Name: Yesenia Joseph Procedure Date: 07/25/2021 9:35 AM MRN: [id number] Endoscopist: Rachael Fee , MD Age: 26 Referring MD:  Date of Birth: Aug 31, 1995 Gender: Female Account #: [id number] Procedure:                Upper GI endoscopy Indications:              Generalized abdominal pain, improved; intermittent                            nausea Medicines:                Monitored Anesthesia Care Procedure:                Pre-Anesthesia Assessment:                           - Prior to the procedure, a History and Physical                            was performed, and patient medications and                            allergies were reviewed. The patient's tolerance of                            previous anesthesia was also reviewed. The risks                            and benefits of the procedure and the sedation                            options and risks were discussed with the patient.                            All questions were answered, and informed consent                            was obtained. Prior Anticoagulants: The patient has                            taken no previous anticoagulant or antiplatelet                            agents. ASA Grade Assessment: II - A patient with                            mild systemic disease. After reviewing the risks                            and benefits, the patient was deemed in                            satisfactory condition to undergo the procedure.  After obtaining informed consent, the endoscope was                            passed under direct vision. Throughout the                            procedure, the patient's blood pressure, pulse, and                            oxygen saturations were monitored continuously. The                            GIF [id number] #[id number] was introduced through the                            mouth, and advanced to the second part of duodenum.                             The upper GI endoscopy was accomplished without                            difficulty. The patient tolerated the procedure                            well. Scope In: Scope Out: Findings:                 Small to medium amount of retained liquid gastric                            secretions.                           Minimal inflammation characterized by erythema and                            friability was found in the gastric antrum.                            Biopsies were taken with a cold forceps for                            histology.                           The exam was otherwise without abnormality. Complications:            No immediate complications. Estimated blood loss:                            None. Estimated Blood Loss:     Estimated blood loss: none. Impression:               - Mild, non-specific gastritis. Biopsied to check                            for H. pylori.                           -  Small to medium amount of retained liquid gastric                            secretions without anatomic gastric outlet                            obstruction.                           - The examination was otherwise normal. Recommendation:           - Patient has a contact number available for                            emergencies. The signs and symptoms of potential                            delayed complications were discussed with the                            patient. Return to normal activities tomorrow.                            Written discharge instructions were provided to the                            patient.                           - Resume previous diet.                           - Continue present medications. New prescription                            given today for zofran 4mg  pills, take one pill                            once daily as needed for nausea. disp 50 with 3                            refills.                            - Await pathology results. , MD 07/25/2021 9:48:43 AM This report has been signed electronically.

## 2021-07-25 NOTE — Progress Notes (Signed)
Called to room to assist during endoscopic procedure.  Patient ID and intended procedure confirmed with present staff. Received instructions for my participation in the procedure from the performing physician.  

## 2021-07-25 NOTE — Progress Notes (Signed)
HPI: This is a woman with imroved abd pains, now with intermittent nausea   ROS: complete GI ROS as described in HPI, all other review negative.  Constitutional:  No unintentional weight loss   Past Medical History:  Diagnosis Date   Complication of anesthesia    Gallstones 10/31/2020   Hyperthyroidism    PCOS (polycystic ovarian syndrome)    PONV (postoperative nausea and vomiting)    Pre-diabetes    SVD (spontaneous vaginal delivery) 02/19/2019    Past Surgical History:  Procedure Laterality Date   CHOLECYSTECTOMY     TONSILLECTOMY     WISDOM TOOTH EXTRACTION      Current Outpatient Medications  Medication Sig Dispense Refill   Multiple Vitamin (MULTIVITAMIN) tablet Take 1 tablet by mouth daily.     hyoscyamine (LEVSIN SL) 0.125 MG SL tablet Place 1 tablet (0.125 mg total) under the tongue every 4 (four) hours as needed. 50 tablet 2   ibuprofen (ADVIL) 800 MG tablet Take 800 mg by mouth every 8 (eight) hours as needed.     Current Facility-Administered Medications  Medication Dose Route Frequency Provider Last Rate Last Admin   0.9 %  sodium chloride infusion  500 mL Intravenous Once Rachael Fee, MD        Allergies as of 07/25/2021 - Review Complete 07/25/2021  Allergen Reaction Noted   Anesthetics, amide Nausea And Vomiting 12/12/2012   Anesthetics, ester Nausea And Vomiting 12/12/2012   Anesthetics, halogenated Nausea And Vomiting 12/12/2012   Codeine Nausea And Vomiting 01/31/2015    Family History  Problem Relation Age of Onset   Cancer Maternal Grandfather    Cancer Paternal Grandmother    Diabetes Paternal Grandfather    Diabetes Other    Hypertension Other    Hyperlipidemia Other    Colon cancer Neg Hx    Rectal cancer Neg Hx     Social History   Socioeconomic History   Marital status: Married    Spouse name: Alex   Number of children: 1   Years of education: Not on file   Highest education level: Not on file  Occupational History   Not  on file  Tobacco Use   Smoking status: Never   Smokeless tobacco: Never  Vaping Use   Vaping Use: Never used  Substance and Sexual Activity   Alcohol use: No    Alcohol/week: 0.0 standard drinks   Drug use: No   Sexual activity: Yes  Other Topics Concern   Not on file  Social History Narrative   Not on file   Social Determinants of Health   Financial Resource Strain: Not on file  Food Insecurity: Not on file  Transportation Needs: Not on file  Physical Activity: Not on file  Stress: Not on file  Social Connections: Not on file  Intimate Partner Violence: Not on file     Physical Exam: BP (!) 107/43    Pulse 77    Temp (!) 97.5 F (36.4 C)    Resp 13    Ht 5\' 2"  (1.575 m)    Wt 175 lb (79.4 kg)    LMP 07/25/2021    SpO2 98%    BMI 32.01 kg/m  Constitutional: generally well-appearing Psychiatric: alert and oriented x3 Lungs: CTA bilaterally Heart: no MCR  Assessment and plan: 26 y.o. female with intermittent nausea  EGD today  Care is appropriate for the ambulatory setting.  34, MD [facility] 07/25/2021, 9:51 AM

## 2021-07-29 ENCOUNTER — Telehealth: Payer: Self-pay | Admitting: *Deleted

## 2021-07-29 NOTE — Telephone Encounter (Signed)
°  Follow up Call-  Call back number 07/25/2021  Post procedure Call Back phone  # [phone]  Permission to leave phone message Yes  Some recent data might be hidden     Patient questions:  Do you have a fever, pain , or abdominal swelling? No. Pain Score  0 *  Have you tolerated food without any problems? Yes.    Have you been able to return to your normal activities? Yes.    Do you have any questions about your discharge instructions: Diet   No. Medications  No. Follow up visit  No.  Do you have questions or concerns about your Care? No.  Actions: * If pain score is 4 or above: No action needed, pain <4.

## 2021-07-30 ENCOUNTER — Encounter: Payer: Self-pay | Admitting: Gastroenterology

## 2021-08-12 DIAGNOSIS — Z30431 Encounter for routine checking of intrauterine contraceptive device: Secondary | ICD-10-CM | POA: Diagnosis not present

## 2021-10-16 ENCOUNTER — Encounter: Payer: Self-pay | Admitting: Emergency Medicine

## 2021-10-16 ENCOUNTER — Ambulatory Visit
Admission: EM | Admit: 2021-10-16 | Discharge: 2021-10-16 | Disposition: A | Discharge status: Home / Self Care | Payer: BC Managed Care – PPO | Attending: Urgent Care | Admitting: Urgent Care

## 2021-10-16 DIAGNOSIS — H109 Unspecified conjunctivitis: Secondary | ICD-10-CM

## 2021-10-16 MED ORDER — TOBRAMYCIN 0.3 % OP SOLN: 1.0000 [drp] | OPHTHALMIC | 0 refills | Status: DC

## 2021-10-16 NOTE — ED Provider Notes (Signed)
?[facility] ? ? ?MRN: [id number] DOB: 04-25-1996 ? ?Subjective:  ? ?Yesenia Joseph is a 26 y.o. female presenting for 1 day history of acute onset right eye redness, irritation, drainage and matting of her eyelashes.  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  No contact lens use, foreign body sensation, photosensitivity, eyelid pain or swelling. ? ?No current facility-administered medications for this encounter. ? ?Current Outpatient Medications:  ?  hyoscyamine (LEVSIN SL) 0.125 MG SL tablet, Place 1 tablet (0.125 mg total) under the tongue every 4 (four) hours as needed., Disp: 50 tablet, Rfl: 2 ?  ibuprofen (ADVIL) 800 MG tablet, Take 800 mg by mouth every 8 (eight) hours as needed., Disp: , Rfl:  ?  Multiple Vitamin (MULTIVITAMIN) tablet, Take 1 tablet by mouth daily., Disp: , Rfl:   ? ?Allergies  ?Allergen Reactions  ? Anesthetics, Amide Nausea And Vomiting  ? Anesthetics, Ester Nausea And Vomiting  ? Anesthetics, Halogenated Nausea And Vomiting  ? Codeine Nausea And Vomiting  ? ? ?Past Medical History:  ?Diagnosis Date  ? Complication of anesthesia   ? Gallstones 10/31/2020  ? Hyperthyroidism   ? PCOS (polycystic ovarian syndrome)   ? PONV (postoperative nausea and vomiting)   ? Pre-diabetes   ? SVD (spontaneous vaginal delivery) 02/19/2019  ?  ? ?Past Surgical History:  ?Procedure Laterality Date  ? CHOLECYSTECTOMY    ? TONSILLECTOMY    ? WISDOM TOOTH EXTRACTION    ? ? ?Family History  ?Problem Relation Age of Onset  ? Cancer Maternal Grandfather   ? Cancer Paternal Grandmother   ? Diabetes Paternal Grandfather   ? Diabetes Other   ? Hypertension Other   ? Hyperlipidemia Other   ? Colon cancer Neg Hx   ? Rectal cancer Neg Hx   ? ? ?Social History  ? ?Tobacco Use  ? Smoking status: Never  ? Smokeless tobacco: Never  ?Vaping Use  ? Vaping Use: Never used  ?Substance Use Topics  ? Alcohol use: No  ?  Alcohol/week: 0.0 standard drinks  ? Drug use: No   ? ? ?ROS ? ? ?Objective:  ? ?Vitals: ?BP 106/69 (BP Location: Right Arm)   Pulse 70   Temp 98.2 ?F (36.8 ?C) (Oral)   Resp 18   SpO2 98%  ? ?Physical Exam ?Constitutional:   ?   General: She is not in acute distress. ?   Appearance: Normal appearance. She is well-developed. She is not ill-appearing, toxic-appearing or diaphoretic.  ?HENT:  ?   Head: Normocephalic and atraumatic.  ?   Nose: Nose normal.  ?   Mouth/Throat:  ?   Mouth: Mucous membranes are moist.  ?Eyes:  ?   General: Lids are normal. Lids are everted, no foreign bodies appreciated. Vision grossly intact. No scleral icterus.    ?   Right eye: No foreign body, discharge or hordeolum.     ?   Left eye: No foreign body, discharge or hordeolum.  ?   Extraocular Movements: Extraocular movements intact.  ?   Right eye: Normal extraocular motion.  ?   Left eye: Normal extraocular motion and no nystagmus.  ?   Conjunctiva/sclera:  ?   Right eye: Right conjunctiva is injected. No chemosis, exudate or hemorrhage. ?   Left eye: Left conjunctiva is not injected. No chemosis, exudate or hemorrhage. ?   Pupils: Pupils are equal, round, and reactive to light.  ?Cardiovascular:  ?   Rate and Rhythm:  Normal rate.  ?Pulmonary:  ?   Effort: Pulmonary effort is normal.  ?Skin: ?   General: Skin is warm and dry.  ?Neurological:  ?   General: No focal deficit present.  ?   Mental Status: She is alert and oriented to person, place, and time.  ?Psychiatric:     ?   Mood and Affect: Mood normal.     ?   Behavior: Behavior normal.  ? ? ? ?Assessment and Plan :  ? ?PDMP not reviewed this encounter. ? ?1. Bacterial conjunctivitis of right eye   ? ?We will use tobramycin to cover for bacterial conjunctivitis of the right eye. Counseled patient on potential for adverse effects with medications prescribed/recommended today, ER and return-to-clinic precautions discussed, patient verbalized understanding. ? ?  ?Wallis Bamberg, PA-C ?10/16/21 1615 ? ?

## 2021-10-16 NOTE — ED Triage Notes (Signed)
Right eye redness and drainage since last night ?

## 2021-10-16 NOTE — Discharge Instructions (Signed)
Please use 1 drop of tobramycin to the right eye every 2 hours for the first 2 days. Thereafter, switch to the instructions on the script of 1 drop every 4 hours. Use medicine for 1 week total.  ?

## 2021-10-17 ENCOUNTER — Ambulatory Visit: Payer: BC Managed Care – PPO

## 2022-06-08 DIAGNOSIS — Z1389 Encounter for screening for other disorder: Secondary | ICD-10-CM | POA: Diagnosis not present

## 2022-06-08 DIAGNOSIS — Z130 Encounter for screening for diseases of the blood and blood-forming organs and certain disorders involving the immune mechanism: Secondary | ICD-10-CM | POA: Diagnosis not present

## 2022-06-08 DIAGNOSIS — R102 Pelvic and perineal pain: Secondary | ICD-10-CM | POA: Diagnosis not present

## 2022-06-08 DIAGNOSIS — Z30431 Encounter for routine checking of intrauterine contraceptive device: Secondary | ICD-10-CM | POA: Diagnosis not present

## 2022-06-08 DIAGNOSIS — Z01411 Encounter for gynecological examination (general) (routine) with abnormal findings: Secondary | ICD-10-CM | POA: Diagnosis not present

## 2022-12-14 ENCOUNTER — Ambulatory Visit: Payer: BC Managed Care – PPO

## 2022-12-14 ENCOUNTER — Ambulatory Visit (INDEPENDENT_AMBULATORY_CARE_PROVIDER_SITE_OTHER): Payer: BC Managed Care – PPO | Admitting: Podiatry

## 2022-12-14 ENCOUNTER — Encounter: Payer: Self-pay | Admitting: Podiatry

## 2022-12-14 VITALS — Ht 62.0 in

## 2022-12-14 DIAGNOSIS — M7751 Other enthesopathy of right foot: Secondary | ICD-10-CM

## 2022-12-14 MED ORDER — TRIAMCINOLONE ACETONIDE 10 MG/ML IJ SUSP
10.0000 mg | Freq: Once | INTRAMUSCULAR | Status: AC
  Administered 2022-12-14: 10 mg

## 2022-12-16 NOTE — Progress Notes (Signed)
Subjective:   Patient ID: Yesenia Joseph, female   DOB: 27 y.o.   MRN: [id number]   HPI Patient presents with a lot of pain in the fourth joint right foot and has been going on around 6 weeks does not remember injury.  States it has been sore to walk on and they did move houses and she was on her feet a lot.  Patient does not smoke likes to be active   Review of Systems  All other systems reviewed and are negative.       Objective:  Physical Exam Vitals and nursing note reviewed.  Constitutional:      Appearance: She is well-developed.  Pulmonary:     Effort: Pulmonary effort is normal.  Musculoskeletal:        General: Normal range of motion.  Skin:    General: Skin is warm.  Neurological:     Mental Status: She is alert.     Neurovascular status intact muscle strength adequate range of motion adequate inflammation pain around the fourth MPJ right with fluid buildup in the joint and discomfort with mild discomfort into the toe itself but it appears to be mostly emanating from the MPJ.  Good digital perfusion well-oriented     Assessment:  Inflammatory capsulitis of the fourth MPJ right most likely cannot rule out digital or nerve pathology     Plan:  H&P the x-rays reviewed went ahead today and did block of the right forefoot aspirated the fourth MPJ getting out a small amount of clear fluid injected quarter cc dexamethasone Kenalog and applied thick plantar padding to offload weight off the joint and advised on rigid bottom shoes  X-rays were negative for signs of fracture appears to be a soft tissue inflammatory condition

## 2022-12-17 IMAGING — US US ABDOMEN LIMITED RUQ/ASCITES
1 series · 15 of 25 positions shown · non-contrast
Comparison: CT 60 14

CLINICAL DATA: Right upper quadrant pain, known gallstones

EXAM:
ULTRASOUND ABDOMEN LIMITED RIGHT UPPER QUADRANT

[Series 1: us abdomen limited ruq/ascites · 15 of 43 slices shown]
[im 1/43]
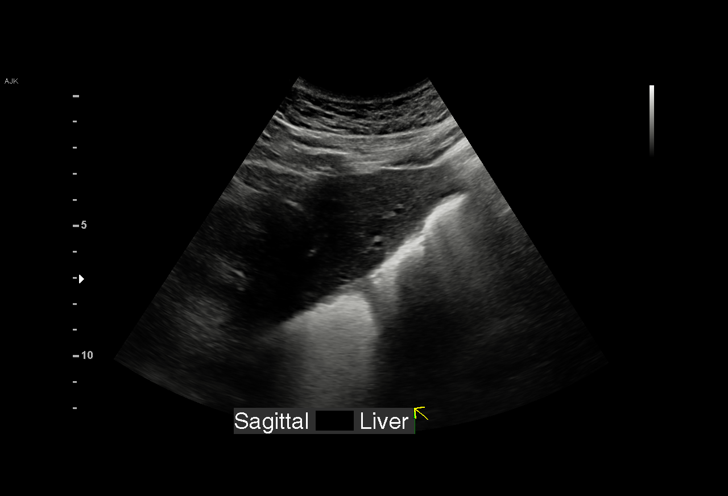
[im 4/43]
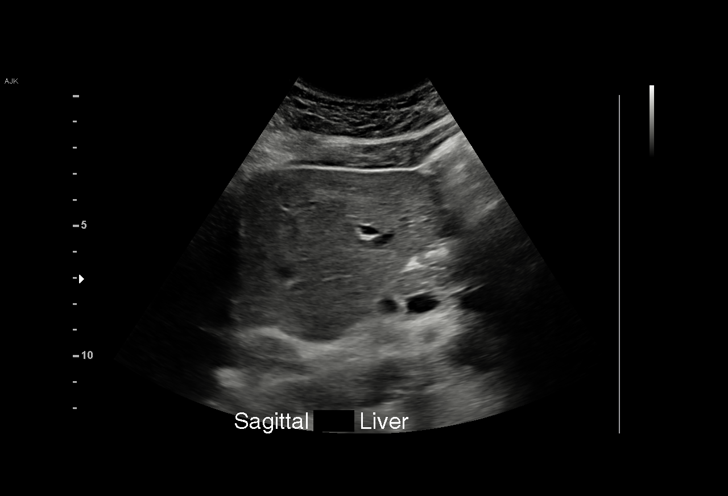
[im 8/43]
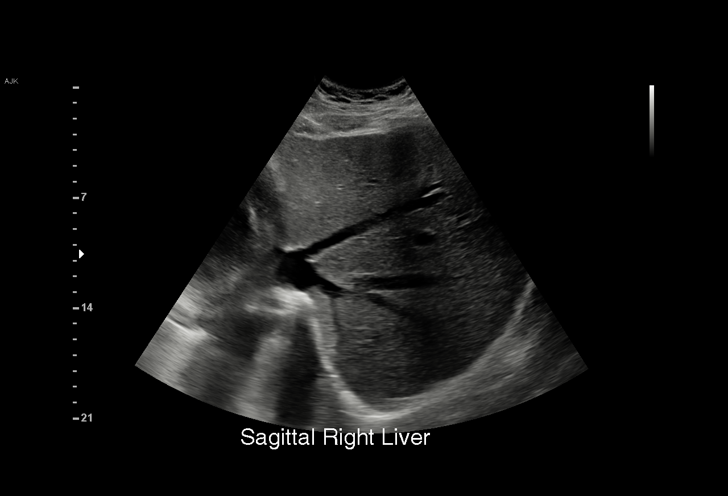
[im 9/43]
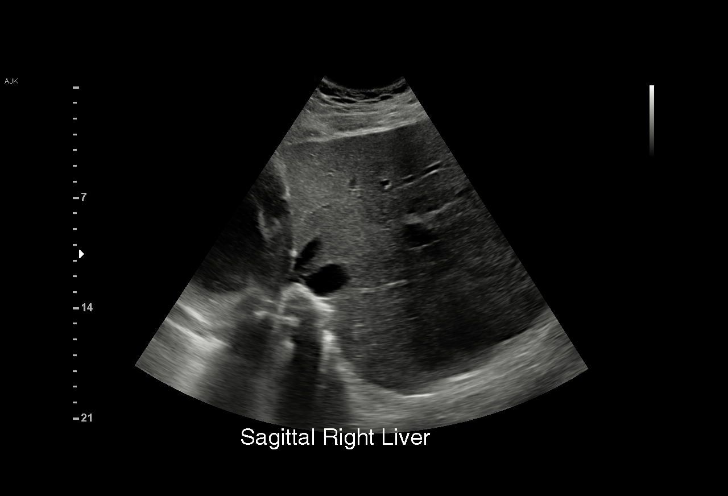
[im 13/43]
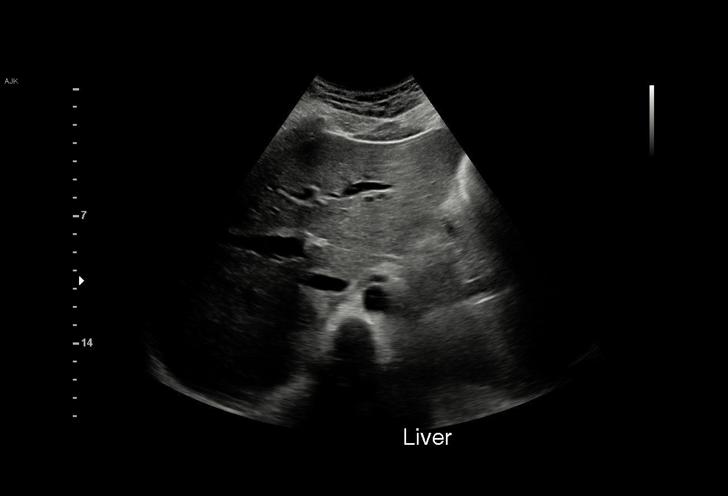
[im 16/43]
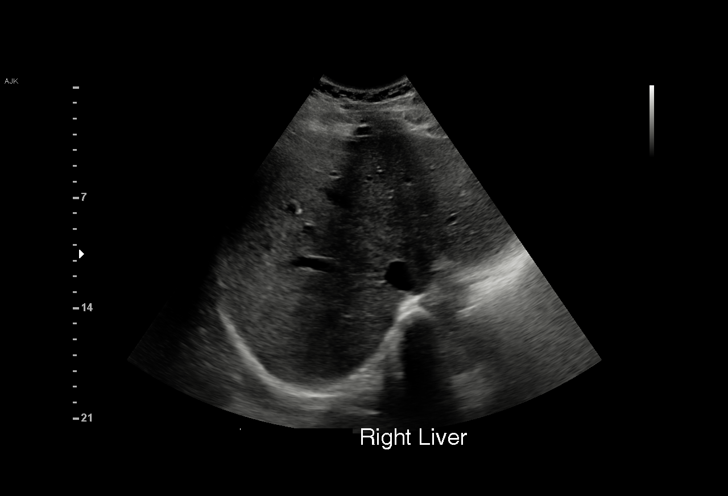
[im 18/43]
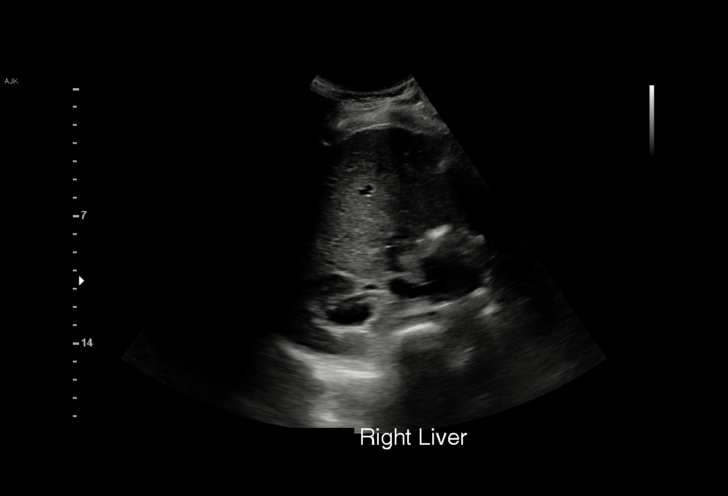
[im 22/43]
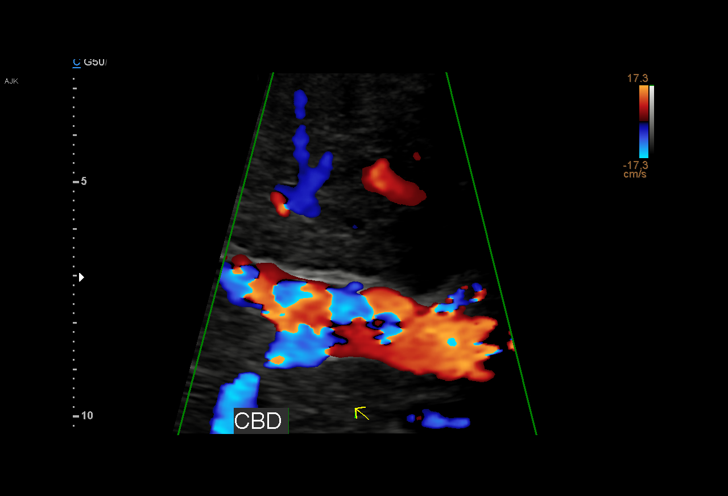
[im 25/43]
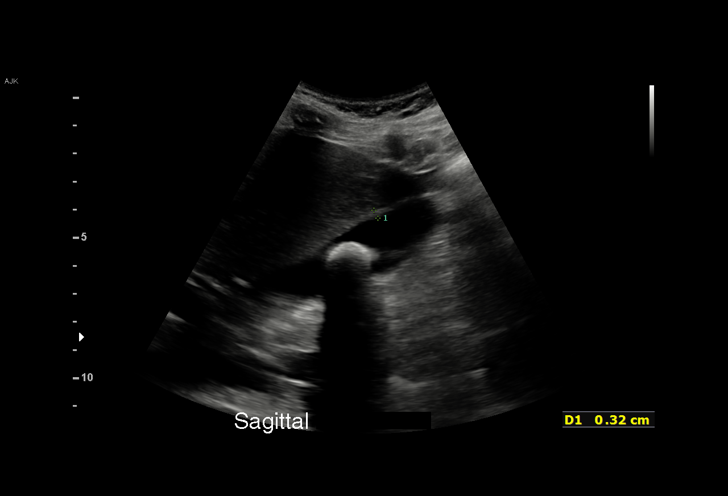
[im 27/43]
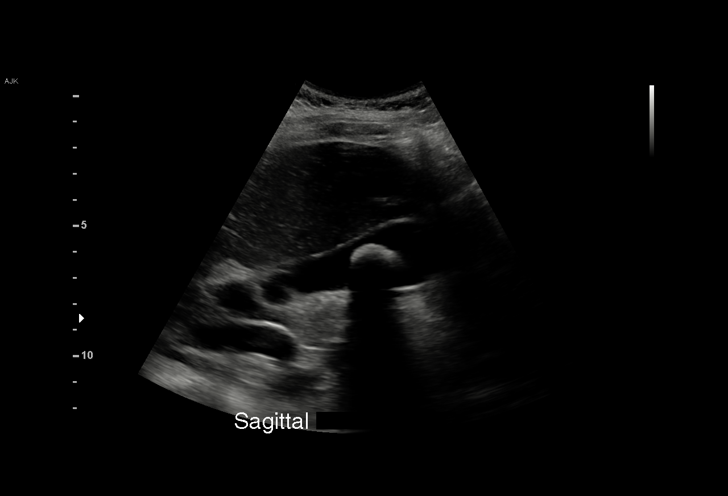
[im 30/43]
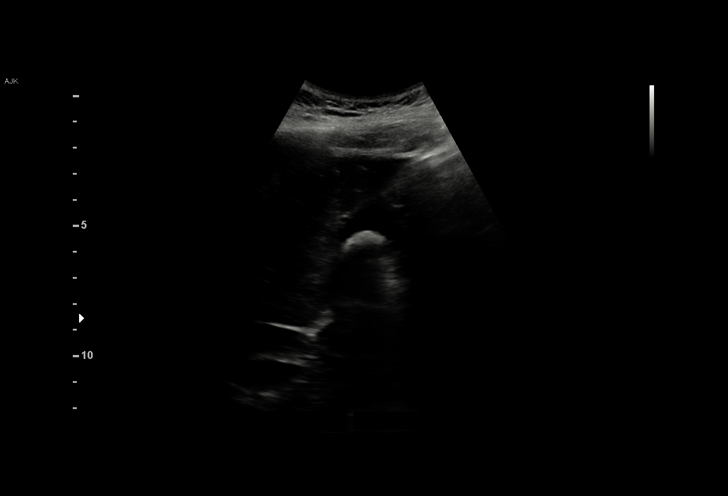
[im 34/43]
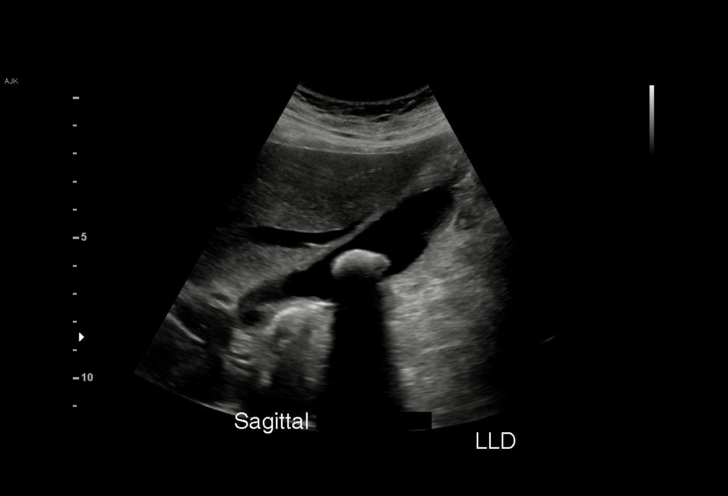
[im 36/43]
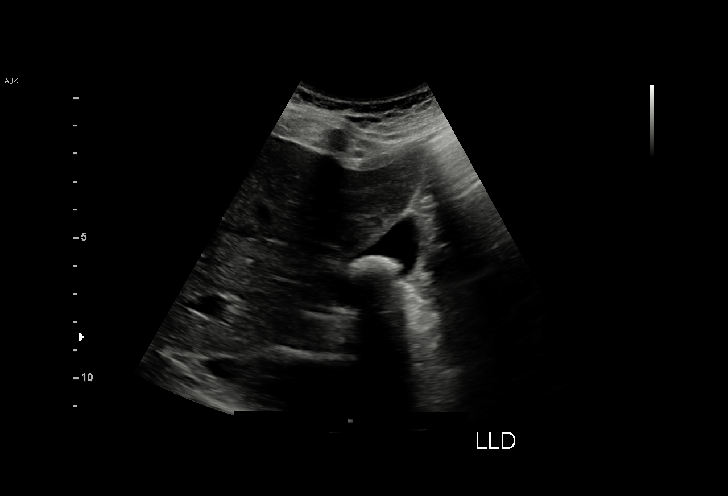
[im 39/43]
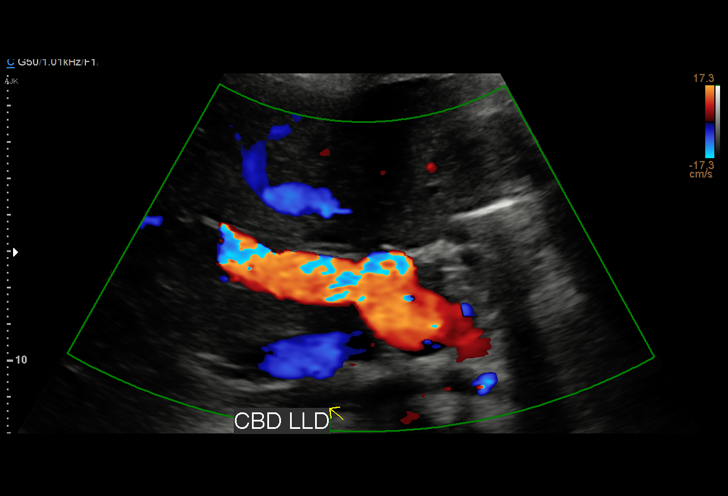
[im 43/43]
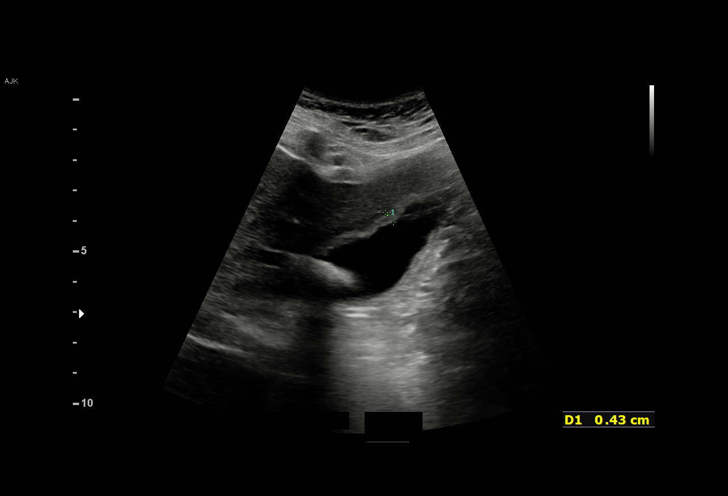

[15 of 25 positions shown; findings below may reference images not displayed]

FINDINGS: Gallbladder:

Mild gallbladder wall thickening, nonspecific given a non NPO
status. However, there is a positive sonographic Murphy sign with
visible gallstone measuring up to 1.8 cm in size. No pericholecystic
fluid is seen.

Common bile duct:

Diameter: 3 mm, nondilated

Liver:

No focal lesion identified. Within normal limits in parenchymal
echogenicity. Portal vein is patent on color Doppler imaging with
normal direction of blood flow towards the liver.

Other: Non-NPO status with continual intake of fluids and a tender
at approximately [DATE] p.m.
IMPRESSION: Cholelithiasis. Mildly thickened gallbladder wall, possibly related
to a non NPO status however a positive sonographic Murphy sign was
noted by sonographer. Findings are equivocal but could reflect an
acute cholecystitis in the appropriate clinical setting. If further
imaging delineation is necessary, HIDA scan should be obtained.

## 2022-12-22 ENCOUNTER — Ambulatory Visit
Admission: RE | Admit: 2022-12-22 | Discharge: 2022-12-22 | Disposition: A | Discharge status: Home / Self Care | Payer: BC Managed Care – PPO | Source: Ambulatory Visit

## 2022-12-22 VITALS — BP 110/71 | HR 74 | Temp 98.1°F | Resp 14

## 2022-12-22 DIAGNOSIS — S90822A Blister (nonthermal), left foot, initial encounter: Secondary | ICD-10-CM

## 2022-12-22 NOTE — ED Triage Notes (Signed)
Pt c/o left foot pain, pt states she thinks she may have a wart on the bottom of her foot, pt states she noticed on Sunday, she is able to bare weight on the foot but it hurt

## 2022-12-22 NOTE — Discharge Instructions (Addendum)
Please keep the blister covered when you are up walking on it.  When you lay down for bed, leave open to air.  Follow up with Podiatry for persistent/worsening symptoms despite treatment.

## 2022-12-22 NOTE — ED Provider Notes (Signed)
[facility]    CSN: [id number] Arrival date & time: 12/22/22  1252      History   Chief Complaint Chief Complaint  Patient presents with   Foot Pain    Wart - Entered by patient    HPI Yesenia Joseph is a 27 y.o. female.   Patient presents today with pain and slightly swollen, dark-colored area to the bottom of her left foot.  She reports the pain began Sunday evening.  No recent trauma, fall, injury, or accident involving that area of her foot.  Reports she took a picture of it Friday and showed her dad who thought it may be a plantar wart.  Patient and her husband tried to poke at the area with a safety pin to allow for drainage without much improvement.  Reports that she is able to bear weight on the foot, however it hurts and feels like there is a rock in her shoe.    Past Medical History:  Diagnosis Date   Complication of anesthesia    Gallstones 10/31/2020   Hyperthyroidism    PCOS (polycystic ovarian syndrome)    PONV (postoperative nausea and vomiting)    Pre-diabetes    SVD (spontaneous vaginal delivery) 02/19/2019    Patient Active Problem List   Diagnosis Date Noted   Cholestasis during pregnancy in third trimester 12/06/2020   NSVD (normal spontaneous vaginal delivery) 12/06/2020   Pregnancy 02/19/2019   SVD (spontaneous vaginal delivery) 02/19/2019   Dysmenorrhea 06/28/2015   PCOS (polycystic ovarian syndrome) 02/17/2013   Menstrual irregularity 02/04/2013   BMI 32.0-32.9,adult 02/04/2013    Past Surgical History:  Procedure Laterality Date   CHOLECYSTECTOMY     TONSILLECTOMY     WISDOM TOOTH EXTRACTION      OB History     Gravida  2   Para  2   Term  2   Preterm      AB      Living  2      SAB      IAB      Ectopic      Multiple  0   Live Births  2            Home Medications    Prior to Admission medications   Medication Sig Start Date End Date Taking? Authorizing Provider  ibuprofen (ADVIL) 800 MG  tablet Take 800 mg by mouth every 8 (eight) hours as needed. 01/31/21   [provider]  Multiple Vitamin (MULTIVITAMIN) tablet Take 1 tablet by mouth daily.    [provider]    Family History Family History  Problem Relation Age of Onset   Cancer Maternal Grandfather    Cancer Paternal Grandmother    Diabetes Paternal Grandfather    Diabetes Other    Hypertension Other    Hyperlipidemia Other    Colon cancer Neg Hx    Rectal cancer Neg Hx     Social History Social History   Tobacco Use   Smoking status: Never   Smokeless tobacco: Never  Vaping Use   Vaping Use: Never used  Substance Use Topics   Alcohol use: No    Alcohol/week: 0.0 standard drinks of alcohol   Drug use: No     Allergies   Anesthetics, amide; Anesthetics, ester; Anesthetics, halogenated; and Codeine   Review of Systems Review of Systems Per HPI  Physical Exam Triage Vital Signs ED Triage Vitals  Enc Vitals Group     BP  12/22/22 1258 110/71     Pulse Rate 12/22/22 1258 74     Resp 12/22/22 1258 14     Temp 12/22/22 1258 98.1 F (36.7 C)     Temp Source 12/22/22 1258 Oral     SpO2 12/22/22 1258 99 %     Weight --      Height --      Head Circumference --      Peak Flow --      Pain Score 12/22/22 1301 4     Pain Loc --      Pain Edu? --      Excl. in GC? --    No data found.  Updated Vital Signs BP 110/71 (BP Location: Right Arm)   Pulse 74   Temp 98.1 F (36.7 C) (Oral)   Resp 14   LMP 12/02/2022 (Exact Date)   SpO2 99%   Breastfeeding No   Visual Acuity Right Eye Distance:   Left Eye Distance:   Bilateral Distance:    Right Eye Near:   Left Eye Near:    Bilateral Near:     Physical Exam Vitals and nursing note reviewed.  Constitutional:      General: She is not in acute distress.    Appearance: Normal appearance. She is not toxic-appearing.  HENT:     Mouth/Throat:     Mouth: Mucous membranes are moist.     Pharynx: Oropharynx is clear.   Pulmonary:     Effort: Pulmonary effort is normal. No respiratory distress.  Musculoskeletal:       Feet:     Comments: Blood blister noted to plantar surface of left heel.  There is slight tenderness to touch.  No active drainage, warmth, fluctuance.  Patient has full range of motion of the left ankle and foot.  Normal strength and sensation to left foot and left foot is neurovascularly intact.  Skin:    General: Skin is warm and dry.     Capillary Refill: Capillary refill takes less than 2 seconds.     Coloration: Skin is not jaundiced or pale.     Findings: No erythema.  Neurological:     Mental Status: She is alert and oriented to person, place, and time.  Psychiatric:        Behavior: Behavior is cooperative.      UC Treatments / Results  Labs (all labs ordered are listed, but only abnormal results are displayed) Labs Reviewed - No data to display  EKG   Radiology No results found.  Procedures Procedures (including critical care time)  Medications Ordered in UC Medications - No data to display  Initial Impression / Assessment and Plan / UC Course  I have reviewed the triage vital signs and the nursing notes.  Pertinent labs & imaging results that were available during my care of the patient were reviewed by me and considered in my medical decision making (see chart for details).   Patient is well-appearing, normotensive, afebrile, not tachycardic, not tachypneic, oxygenating well on room air.    1. Blister of plantar aspect of left foot, initial encounter Discussed offloading of pressure over blistered area Discussed with patient that this is not classical plantar wart presentation Recommended over-the-counter wart treatments if she develops a hard mass on the outside of her foot, can also follow-up with podiatry regarding symptoms  The patient was given the opportunity to ask questions.  All questions answered to their satisfaction.  The patient is in agreement  to this plan.    Final Clinical Impressions(s) / UC Diagnoses   Final diagnoses:  Blister of plantar aspect of left foot, initial encounter     Discharge Instructions      Please keep the blister covered when you are up walking on it.  When you lay down for bed, leave open to air.  Follow up with Podiatry for persistent/worsening symptoms despite treatment.    ED Prescriptions   None    PDMP not reviewed this encounter.   Valentino Nose, NP 12/22/22 1330

## 2023-01-04 ENCOUNTER — Ambulatory Visit (INDEPENDENT_AMBULATORY_CARE_PROVIDER_SITE_OTHER): Payer: BC Managed Care – PPO | Admitting: Podiatry

## 2023-01-04 ENCOUNTER — Encounter: Payer: Self-pay | Admitting: Podiatry

## 2023-01-04 DIAGNOSIS — M7751 Other enthesopathy of right foot: Secondary | ICD-10-CM

## 2023-01-04 DIAGNOSIS — U071 COVID-19: Secondary | ICD-10-CM | POA: Insufficient documentation

## 2023-01-04 DIAGNOSIS — K8020 Calculus of gallbladder without cholecystitis without obstruction: Secondary | ICD-10-CM | POA: Insufficient documentation

## 2023-01-04 DIAGNOSIS — K8063 Calculus of gallbladder and bile duct with acute cholecystitis with obstruction: Secondary | ICD-10-CM | POA: Insufficient documentation

## 2023-01-06 MED ORDER — MELOXICAM 15 MG PO TABS: 15.0000 mg | ORAL_TABLET | Freq: Every day | ORAL | 2 refills | Status: DC

## 2023-01-06 NOTE — Progress Notes (Signed)
Subjective:   Patient ID: Yesenia Joseph, female   DOB: 27 y.o.   MRN: [id number]   HPI Patient states she still slightly improved but still having a lot of pain in her right forefoot around the joint.  States that ambulation seems to be the trigger   ROS      Objective:  Physical Exam  Neurovascular status intact with inflammation pain still noted fourth MPJ right with diminishment of the intense discomfort but still quite a bit of discomfort with movement of the joint     Assessment:  Continued inflammation pain of the fourth MPJ right fluid buildup around the joint still noted mild improvement in symptoms     Plan:  H&P reviewed and due to the fact that she is on her foot a lot I recommended immobilization to try to take all forefoot weight off and allow this to rest and hopefully heal without requiring any more aggressive treatment.  Reappoint 3 weeks and also started her on Mobic at this time

## 2023-01-11 ENCOUNTER — Encounter: Payer: Self-pay | Admitting: Podiatry

## 2023-01-21 ENCOUNTER — Encounter: Payer: Self-pay | Admitting: Podiatry

## 2023-01-21 ENCOUNTER — Ambulatory Visit: Payer: BC Managed Care – PPO | Admitting: Podiatry

## 2023-01-21 DIAGNOSIS — M7751 Other enthesopathy of right foot: Secondary | ICD-10-CM | POA: Diagnosis not present

## 2023-01-21 NOTE — Progress Notes (Signed)
Subjective:   Patient ID: Yesenia Joseph, female   DOB: 27 y.o.   MRN: [id number]   HPI Patient presents stating the pain has improved some but it still sore and she just found out today that she is pregnant   ROS      Objective:  Physical Exam  Neurovascular status intact continued inflammation around the fourth MPJ right improved with immobilization but still present upon deep palpation     Assessment:  Inflammatory capsulitis which remains a problem fourth MPJ right     Plan:  H&P reviewed and I went ahead today and we will continue immobilization she is to call her OB about another couple weeks of Mobic but will stop it until she hears back whether they recommend any more or better to stop it and we discussed orthotics to try to offload the joint.  I want to see her back 6 weeks she will wear the boot or rigid bottom shoes and be reevaluated at that time

## 2023-01-25 ENCOUNTER — Ambulatory Visit: Payer: BC Managed Care – PPO | Admitting: Podiatry

## 2023-03-04 ENCOUNTER — Ambulatory Visit: Payer: BC Managed Care – PPO | Admitting: Podiatry

## 2023-03-09 LAB — OB RESULTS CONSOLE RPR: RPR: NONREACTIVE

## 2023-03-09 LAB — OB RESULTS CONSOLE RUBELLA ANTIBODY, IGM: Rubella: IMMUNE

## 2023-03-09 LAB — HEPATITIS C ANTIBODY: HCV Ab: NEGATIVE

## 2023-03-09 LAB — OB RESULTS CONSOLE GC/CHLAMYDIA
Chlamydia: NEGATIVE
Neisseria Gonorrhea: NEGATIVE

## 2023-03-09 LAB — OB RESULTS CONSOLE HEPATITIS B SURFACE ANTIGEN: Hepatitis B Surface Ag: NEGATIVE

## 2023-03-09 LAB — OB RESULTS CONSOLE HIV ANTIBODY (ROUTINE TESTING): HIV: NONREACTIVE

## 2023-03-09 LAB — OB RESULTS CONSOLE ANTIBODY SCREEN: Antibody Screen: NEGATIVE

## 2023-03-10 ENCOUNTER — Encounter: Payer: Self-pay | Admitting: Podiatry

## 2023-03-10 ENCOUNTER — Telehealth: Payer: Self-pay | Admitting: Podiatry

## 2023-03-10 ENCOUNTER — Ambulatory Visit (INDEPENDENT_AMBULATORY_CARE_PROVIDER_SITE_OTHER): Payer: BC Managed Care – PPO | Admitting: Podiatry

## 2023-03-10 DIAGNOSIS — M7751 Other enthesopathy of right foot: Secondary | ICD-10-CM

## 2023-03-10 NOTE — Telephone Encounter (Signed)
Pt called and would like to hold off on the ordering her orthotics she wants to check insurance first.  I notified Nicki Guadalajara and Dr Charlsie Merles.

## 2023-03-10 NOTE — Progress Notes (Signed)
Subjective:   Patient ID: Yesenia Joseph, female   DOB: 27 y.o.   MRN: [id number]   HPI Patient presents stating it is doing some better still bothers me at times but improved   ROS      Objective:  Physical Exam  Neuro vascular status intact with inflammation pain of a mild nature fourth MPJ right with additional chronic pressure on the joint surface     Assessment:  Inflammatory capsulitis of the fourth MPJ right fluid buildup     Plan:  H&P reviewed boot can be used along with stiff bottom shoes with patient having some improvement and I have recommended long-term orthotics to offload weight from the joint surface.  Patient will be seen back and did have tentative casting done today and we will get the send out when it works for her from a time perspective.  She will let us know if any symptoms recur she is pregnant and is allowed to take Mobic according to OB/GYN

## 2023-04-03 ENCOUNTER — Other Ambulatory Visit: Payer: Self-pay | Admitting: Podiatry

## 2023-07-14 NOTE — L&D Delivery Note (Signed)
 Delivery Note Pt labored well to complete. She pushed twice and at 4:49 PM a viable female was delivered via  (Presentation:  LOA    ).  APGAR:9 ,9 ; weight pending Loose nuchal x 1 reduced over infants head. Anterior and posterior shoulders spontaneously delivered next, Body followed easily Cord clamped and cut after a minute delay with infant on mothers chest. Cord blood obtained  .   Placenta status: Spontaneous, Schultz, Intact.  Cord: 3vc  with the following complications:  none.  Cord pH: n.a  Anesthesia:  Epidural  Episiotomy:  none Lacerations:  none Suture Repair:  n/a Est. Blood Loss (mL): 100   Mom to postpartum.  Baby to Couplet care / Skin to Skin.  Cathrine Muster 09/14/2023, 5:05 PM

## 2023-09-02 ENCOUNTER — Telehealth (HOSPITAL_COMMUNITY): Payer: Self-pay | Admitting: *Deleted

## 2023-09-02 ENCOUNTER — Encounter (HOSPITAL_COMMUNITY): Payer: Self-pay | Admitting: *Deleted

## 2023-09-02 LAB — OB RESULTS CONSOLE GBS: GBS: POSITIVE

## 2023-09-02 NOTE — Telephone Encounter (Signed)
 Preadmission screen

## 2023-09-03 ENCOUNTER — Encounter (HOSPITAL_COMMUNITY): Payer: Self-pay | Admitting: *Deleted

## 2023-09-14 ENCOUNTER — Encounter (HOSPITAL_COMMUNITY): Payer: Self-pay | Admitting: Obstetrics and Gynecology

## 2023-09-14 ENCOUNTER — Inpatient Hospital Stay (HOSPITAL_COMMUNITY): Payer: BC Managed Care – PPO

## 2023-09-14 ENCOUNTER — Inpatient Hospital Stay (HOSPITAL_COMMUNITY): Admitting: Anesthesiology

## 2023-09-14 ENCOUNTER — Other Ambulatory Visit: Payer: Self-pay

## 2023-09-14 ENCOUNTER — Inpatient Hospital Stay (HOSPITAL_COMMUNITY)

## 2023-09-14 ENCOUNTER — Inpatient Hospital Stay (HOSPITAL_COMMUNITY)
Admission: RE | Admit: 2023-09-14 | Discharge: 2023-09-15 | DRG: 807 | Disposition: A | Discharge status: Home / Self Care | Payer: BC Managed Care – PPO | Attending: Obstetrics and Gynecology | Admitting: Obstetrics and Gynecology

## 2023-09-14 DIAGNOSIS — O99824 Streptococcus B carrier state complicating childbirth: Secondary | ICD-10-CM | POA: Diagnosis present

## 2023-09-14 DIAGNOSIS — Z3A37 37 weeks gestation of pregnancy: Secondary | ICD-10-CM | POA: Diagnosis not present

## 2023-09-14 DIAGNOSIS — E7879 Other disorders of bile acid and cholesterol metabolism: Secondary | ICD-10-CM | POA: Diagnosis present

## 2023-09-14 DIAGNOSIS — Z833 Family history of diabetes mellitus: Secondary | ICD-10-CM | POA: Diagnosis not present

## 2023-09-14 DIAGNOSIS — Z8249 Family history of ischemic heart disease and other diseases of the circulatory system: Secondary | ICD-10-CM | POA: Diagnosis not present

## 2023-09-14 DIAGNOSIS — K7689 Other specified diseases of liver: Secondary | ICD-10-CM | POA: Diagnosis present

## 2023-09-14 DIAGNOSIS — O26643 Intrahepatic cholestasis of pregnancy, third trimester: Secondary | ICD-10-CM | POA: Diagnosis present

## 2023-09-14 LAB — CBC
HCT: 35.7 % — ABNORMAL LOW (ref 36.0–46.0)
Hemoglobin: 12.2 g/dL (ref 12.0–15.0)
MCH: 29.8 pg (ref 26.0–34.0)
MCHC: 34.2 g/dL (ref 30.0–36.0)
MCV: 87.3 fL (ref 80.0–100.0)
Platelets: 241 10*3/uL (ref 150–400)
RBC: 4.09 MIL/uL (ref 3.87–5.11)
RDW: 13.7 % (ref 11.5–15.5)
WBC: 10.3 10*3/uL (ref 4.0–10.5)
nRBC: 0 % (ref 0.0–0.2)

## 2023-09-14 LAB — RPR: RPR Ser Ql: NONREACTIVE

## 2023-09-14 LAB — TYPE AND SCREEN
ABO/RH(D): A POS
Antibody Screen: NEGATIVE

## 2023-09-14 MED ORDER — OXYCODONE HCL 5 MG PO TABS: 5.0000 mg | ORAL_TABLET | ORAL | Status: DC | PRN

## 2023-09-14 MED ORDER — DIPHENHYDRAMINE HCL 25 MG PO CAPS: 25.0000 mg | ORAL_CAPSULE | Freq: Four times a day (QID) | ORAL | Status: DC | PRN

## 2023-09-14 MED ORDER — SODIUM CHLORIDE 0.9 % IV SOLN
5.0000 10*6.[IU] | Freq: Once | INTRAVENOUS | Status: AC
  Administered 2023-09-14: 5 10*6.[IU] via INTRAVENOUS
  Filled 2023-09-14: qty 5

## 2023-09-14 MED ORDER — PHENYLEPHRINE 80 MCG/ML (10ML) SYRINGE FOR IV PUSH (FOR BLOOD PRESSURE SUPPORT)
80.0000 ug | PREFILLED_SYRINGE | INTRAVENOUS | Status: DC | PRN
  Filled 2023-09-14: qty 10

## 2023-09-14 MED ORDER — ONDANSETRON HCL 4 MG/2ML IJ SOLN: 4.0000 mg | Freq: Four times a day (QID) | INTRAMUSCULAR | Status: DC | PRN

## 2023-09-14 MED ORDER — EPHEDRINE 5 MG/ML INJ: 10.0000 mg | INTRAVENOUS | Status: DC | PRN

## 2023-09-14 MED ORDER — FENTANYL-BUPIVACAINE-NACL 0.5-0.125-0.9 MG/250ML-% EP SOLN
12.0000 mL/h | EPIDURAL | Status: DC | PRN
  Administered 2023-09-14: 12 mL/h via EPIDURAL
  Filled 2023-09-14: qty 250

## 2023-09-14 MED ORDER — TETANUS-DIPHTH-ACELL PERTUSSIS 5-2.5-18.5 LF-MCG/0.5 IM SUSY: 0.5000 mL | PREFILLED_SYRINGE | Freq: Once | INTRAMUSCULAR | Status: DC

## 2023-09-14 MED ORDER — OXYTOCIN-SODIUM CHLORIDE 30-0.9 UT/500ML-% IV SOLN
1.0000 m[IU]/min | INTRAVENOUS | Status: DC
  Administered 2023-09-14: 2 m[IU]/min via INTRAVENOUS
  Filled 2023-09-14: qty 500

## 2023-09-14 MED ORDER — OXYTOCIN BOLUS FROM INFUSION: 333.0000 mL | Freq: Once | INTRAVENOUS | Status: DC

## 2023-09-14 MED ORDER — OXYCODONE-ACETAMINOPHEN 5-325 MG PO TABS: 1.0000 | ORAL_TABLET | ORAL | Status: DC | PRN

## 2023-09-14 MED ORDER — OXYCODONE HCL 5 MG PO TABS: 10.0000 mg | ORAL_TABLET | ORAL | Status: DC | PRN

## 2023-09-14 MED ORDER — SIMETHICONE 80 MG PO CHEW: 80.0000 mg | CHEWABLE_TABLET | ORAL | Status: DC | PRN

## 2023-09-14 MED ORDER — IBUPROFEN 600 MG PO TABS
600.0000 mg | ORAL_TABLET | Freq: Four times a day (QID) | ORAL | Status: DC
  Administered 2023-09-14 – 2023-09-15 (×5): 600 mg via ORAL
  Filled 2023-09-14 (×5): qty 1

## 2023-09-14 MED ORDER — SOD CITRATE-CITRIC ACID 500-334 MG/5ML PO SOLN: 30.0000 mL | ORAL | Status: DC | PRN

## 2023-09-14 MED ORDER — ZOLPIDEM TARTRATE 5 MG PO TABS: 5.0000 mg | ORAL_TABLET | Freq: Every evening | ORAL | Status: DC | PRN

## 2023-09-14 MED ORDER — COCONUT OIL OIL
1.0000 | TOPICAL_OIL | Status: DC | PRN
  Administered 2023-09-14: 1 via TOPICAL

## 2023-09-14 MED ORDER — ACETAMINOPHEN 325 MG PO TABS: 650.0000 mg | ORAL_TABLET | ORAL | Status: DC | PRN

## 2023-09-14 MED ORDER — TERBUTALINE SULFATE 1 MG/ML IJ SOLN: 0.2500 mg | Freq: Once | INTRAMUSCULAR | Status: DC | PRN

## 2023-09-14 MED ORDER — LACTATED RINGERS IV SOLN: INTRAVENOUS | Status: DC

## 2023-09-14 MED ORDER — LIDOCAINE HCL (PF) 1 % IJ SOLN
INTRAMUSCULAR | Status: DC | PRN
  Administered 2023-09-14: 8 mL via EPIDURAL

## 2023-09-14 MED ORDER — WITCH HAZEL-GLYCERIN EX PADS
1.0000 | MEDICATED_PAD | CUTANEOUS | Status: DC | PRN
  Administered 2023-09-14: 1 via TOPICAL

## 2023-09-14 MED ORDER — ONDANSETRON HCL 4 MG/2ML IJ SOLN: 4.0000 mg | INTRAMUSCULAR | Status: DC | PRN

## 2023-09-14 MED ORDER — OXYTOCIN-SODIUM CHLORIDE 30-0.9 UT/500ML-% IV SOLN: 2.5000 [IU]/h | INTRAVENOUS | Status: DC | PRN

## 2023-09-14 MED ORDER — DIBUCAINE (PERIANAL) 1 % EX OINT: 1.0000 | TOPICAL_OINTMENT | CUTANEOUS | Status: DC | PRN

## 2023-09-14 MED ORDER — ONDANSETRON HCL 4 MG PO TABS: 4.0000 mg | ORAL_TABLET | ORAL | Status: DC | PRN

## 2023-09-14 MED ORDER — OXYCODONE-ACETAMINOPHEN 5-325 MG PO TABS: 2.0000 | ORAL_TABLET | ORAL | Status: DC | PRN

## 2023-09-14 MED ORDER — PENICILLIN G POT IN DEXTROSE 60000 UNIT/ML IV SOLN
3.0000 10*6.[IU] | INTRAVENOUS | Status: DC
  Administered 2023-09-14: 3 10*6.[IU] via INTRAVENOUS
  Filled 2023-09-14: qty 50

## 2023-09-14 MED ORDER — LACTATED RINGERS IV SOLN: 500.0000 mL | INTRAVENOUS | Status: DC | PRN

## 2023-09-14 MED ORDER — PRENATAL MULTIVITAMIN CH
1.0000 | ORAL_TABLET | Freq: Every day | ORAL | Status: DC
  Administered 2023-09-15: 1 via ORAL
  Filled 2023-09-14: qty 1

## 2023-09-14 MED ORDER — PHENYLEPHRINE 80 MCG/ML (10ML) SYRINGE FOR IV PUSH (FOR BLOOD PRESSURE SUPPORT): 80.0000 ug | PREFILLED_SYRINGE | INTRAVENOUS | Status: DC | PRN

## 2023-09-14 MED ORDER — SENNOSIDES-DOCUSATE SODIUM 8.6-50 MG PO TABS
2.0000 | ORAL_TABLET | Freq: Every day | ORAL | Status: DC
  Administered 2023-09-15: 2 via ORAL
  Filled 2023-09-14: qty 2

## 2023-09-14 MED ORDER — DIPHENHYDRAMINE HCL 50 MG/ML IJ SOLN: 12.5000 mg | INTRAMUSCULAR | Status: DC | PRN

## 2023-09-14 MED ORDER — BENZOCAINE-MENTHOL 20-0.5 % EX AERO
1.0000 | INHALATION_SPRAY | CUTANEOUS | Status: DC | PRN
  Administered 2023-09-14: 1 via TOPICAL
  Filled 2023-09-14: qty 56

## 2023-09-14 MED ORDER — OXYTOCIN-SODIUM CHLORIDE 30-0.9 UT/500ML-% IV SOLN: 2.5000 [IU]/h | INTRAVENOUS | Status: DC

## 2023-09-14 MED ORDER — LACTATED RINGERS IV SOLN
500.0000 mL | Freq: Once | INTRAVENOUS | Status: AC
  Administered 2023-09-14: 500 mL via INTRAVENOUS

## 2023-09-14 NOTE — Lactation Note (Signed)
 This note was copied from a baby's chart. Lactation Consultation Note  Patient Name: Yesenia Joseph UUVOZ'D Date: 09/14/2023 Age:30 hours Reason for consult: Initial assessment;Early term 37-38.6wks;Maternal endocrine disorder Experienced BF mom stated the baby has BF well for a short time but the baby isn't very hungry right now. They had to suction baby of fluid after delivery. Mom feels the baby probably has some fluid in her stomach that is why she isn't hungry yet. LC stated agreed. Newborn feeding habits, behavior, STS, I&O reviewed. Mom encouraged to feed baby 8-12 times/24 hours and with feeding cues. Encourage to try to BF every 3 hrs and w/cues. Encouraged mom to call for assistance or questions. Maternal Data    Feeding    LATCH Score                    Lactation Tools Discussed/Used    Interventions Interventions: Breast feeding basics reviewed;LC Services brochure  Discharge    Consult Status Consult Status: PRN    Simcha Farrington, Diamond Nickel 09/14/2023, 11:30 PM

## 2023-09-14 NOTE — Progress Notes (Signed)
 Patient ID: Yesenia Joseph, female   DOB: May 12, 1996, 28 y.o.   MRN: [id number] Pt comfortable with epidural. Foley removed  SVE 5/50/-2  AROM with copious clear fluid noted   Plan: continue on pitocin per protocol           Expectant mgmt

## 2023-09-14 NOTE — Anesthesia Procedure Notes (Signed)
 Epidural Patient location during procedure: OB Start time: 09/14/2023 11:33 AM End time: 09/14/2023 11:43 AM  Staffing Anesthesiologist: Lannie Fields, DO Performed: anesthesiologist   Preanesthetic Checklist Completed: patient identified, IV checked, risks and benefits discussed, monitors and equipment checked, pre-op evaluation and timeout performed  Epidural Patient position: sitting Prep: DuraPrep and site prepped and draped Patient monitoring: continuous pulse ox, blood pressure, heart rate and cardiac monitor Approach: midline Location: L3-L4 Injection technique: LOR air  Needle:  Needle type: Tuohy  Needle gauge: 17 G Needle length: 9 cm Needle insertion depth: 7 cm Catheter type: closed end flexible Catheter size: 19 Gauge Catheter at skin depth: 12 cm Test dose: negative  Assessment Sensory level: T8 Events: blood not aspirated, no cerebrospinal fluid, injection not painful, no injection resistance, no paresthesia and negative IV test  Additional Notes Patient identified. Risks/Benefits/Options discussed with patient including but not limited to bleeding, infection, nerve damage, paralysis, failed block, incomplete pain control, headache, blood pressure changes, nausea, vomiting, reactions to medication both or allergic, itching and postpartum back pain. Confirmed with bedside nurse the patient's most recent platelet count. Confirmed with patient that they are not currently taking any anticoagulation, have any bleeding history or any family history of bleeding disorders. Patient expressed understanding and wished to proceed. All questions were answered. Sterile technique was used throughout the entire procedure. Please see nursing notes for vital signs. Test dose was given through epidural catheter and negative prior to continuing to dose epidural or start infusion. Warning signs of high block given to the patient including shortness of breath, tingling/numbness in  hands, complete motor block, or any concerning symptoms with instructions to call for help. Patient was given instructions on fall risk and not to get out of bed. All questions and concerns addressed with instructions to call with any issues or inadequate analgesia.  Reason for block:procedure for pain

## 2023-09-14 NOTE — H&P (Signed)
 Yesenia Joseph is a 28 y.o.G65P2002 female presenting for IOL at 79 1/7wks due to cholestasis of pregnancy. On ursodiol Pt is dated per .10week [place] which was cw LMP. Her pregnancy has been otherwise uncomplicated She is GBS pos. Declined carrier testing OB History     Gravida  3   Para  2   Term  2   Preterm      AB      Living  2      SAB      IAB      Ectopic      Multiple  0   Live Births  2          Past Medical History:  Diagnosis Date   Cholestasis during pregnancy    Complication of anesthesia    Gallstones 10/31/2020   Hyperthyroidism    PCOS (polycystic ovarian syndrome)    PONV (postoperative nausea and vomiting)    Pre-diabetes    SVD (spontaneous vaginal delivery) 02/19/2019   Past Surgical History:  Procedure Laterality Date   CHOLECYSTECTOMY     TONSILLECTOMY     WISDOM TOOTH EXTRACTION     Family History: family history includes Cancer in her maternal grandfather and paternal grandmother; Diabetes in her paternal grandfather and another family member; Hyperlipidemia in an other family member; Hypertension in an other family member. Social History:  reports that she has never smoked. She has never used smokeless tobacco. She reports that she does not drink alcohol and does not use drugs.     Maternal Diabetes: No Genetic Screening: Declined Maternal Ultrasounds/Referrals: Normal Fetal Ultrasounds or other Referrals:  None Maternal Substance Abuse:  No Significant Maternal Medications:  None Significant Maternal Lab Results:  Group B Strep positive Number of Prenatal Visits:greater than 3 verified prenatal visits Maternal Vaccinations:TDap and Flu Other Comments:  None  Review of Systems  Constitutional:  Positive for activity change. Negative for fatigue and fever.  Eyes:  Negative for photophobia and visual disturbance.  Respiratory:  Negative for chest tightness and shortness of breath.   Cardiovascular:  Negative for chest pain,  palpitations and leg swelling.  Genitourinary:  Negative for pelvic pain.  Musculoskeletal:  Negative for back pain.  Skin:  Negative for rash.  Neurological:  Negative for seizures and headaches.  Psychiatric/Behavioral:  The patient is nervous/anxious.    Maternal Medical History:  Reason for admission: IOL for cholestasis of pregnancy   Contractions: Frequency: rare.   Perceived severity is mild.   Fetal activity: Perceived fetal activity is normal.   Prenatal Complications - Diabetes: none.   Dilation: 2 Effacement (%): 50 Station: -2 Exam by:: Jamieson Hetland Blood pressure (!) 98/56, pulse 90, temperature 97.7 F (36.5 C), temperature source Oral, resp. rate 17, height 5\' 2"  (1.575 m), weight 94.3 kg, last menstrual period 12/02/2022. Maternal Exam:  Uterine Assessment: Contraction strength is mild.  Contraction frequency is rare.  Abdomen: Patient reports no abdominal tenderness. Estimated fetal weight is AGA.   Fetal presentation: vertex Introitus: Normal vulva. Vulva is negative for condylomata.  Normal vagina.  Vagina is negative for condylomata.  Pelvis: adequate for delivery.   Cervix: Cervix evaluated by digital exam.     Fetal Exam Fetal Monitor Review: Baseline rate: 120.  Variability: moderate (6-25 bpm).   Pattern: accelerations present and no decelerations.   Fetal State Assessment: Category I - tracings are normal.   Physical Exam Constitutional:      Appearance: Normal appearance. She is normal weight.  Cardiovascular:     Rate and Rhythm: Normal rate.     Pulses: Normal pulses.  Pulmonary:     Effort: Pulmonary effort is normal.  Abdominal:     Palpations: Abdomen is soft.  Genitourinary:    General: Normal vulva.  Musculoskeletal:        General: Normal range of motion.     Cervical back: Normal range of motion.  Skin:    General: Skin is warm and dry.     Capillary Refill: Capillary refill takes 2 to 3 seconds.  Neurological:     General: No  focal deficit present.     Mental Status: She is alert and oriented to person, place, and time. Mental status is at baseline.  Psychiatric:        Mood and Affect: Mood normal.        Behavior: Behavior normal.        Thought Content: Thought content normal.     Prenatal labs: ABO, Rh: --/--/A POS (03/04 2956) Antibody: NEG (03/04 0605) Rubella: Immune (08/27 0000) RPR: NON REACTIVE (03/04 0618)  HBsAg: Negative (08/27 0000)  HIV: Non-reactive (08/27 0000)  GBS: Positive/-- (02/20 0000)   Assessment/Plan: 19HY [id number] female at 36 1/7wks for IOL due to cholestasis of pregnancy - S/P cytotec overnight - On pitocin, foley bulb placed with 60cc fluid - Anticipate svd - Epidural prn pt request    Cathrine Muster 09/14/2023, 9:54 AM

## 2023-09-14 NOTE — Anesthesia Preprocedure Evaluation (Signed)
 Anesthesia Evaluation  Patient identified by MRN, date of birth, ID band Patient awake    Reviewed: Allergy & Precautions, Patient's Chart, lab work & pertinent test results  History of Anesthesia Complications (+) PONV and history of anesthetic complications  Airway Mallampati: II  TM Distance: >3 FB Neck ROM: Full    Dental no notable dental hx.    Pulmonary neg pulmonary ROS   Pulmonary exam normal breath sounds clear to auscultation       Cardiovascular negative cardio ROS Normal cardiovascular exam Rhythm:Regular Rate:Normal     Neuro/Psych negative neurological ROS  negative psych ROS   GI/Hepatic negative GI ROS, Neg liver ROS,,,  Endo/Other  BMI 38  Renal/GU negative Renal ROS  negative genitourinary   Musculoskeletal negative musculoskeletal ROS (+)    Abdominal   Peds negative pediatric ROS (+)  Hematology negative hematology ROS (+) Hb 12.2, plt 241   Anesthesia Other Findings   Reproductive/Obstetrics (+) Pregnancy                             Anesthesia Physical Anesthesia Plan  ASA: 2  Anesthesia Plan: Epidural   Post-op Pain Management:    Induction:   PONV Risk Score and Plan: 2  Airway Management Planned: Natural Airway  Additional Equipment: None  Intra-op Plan:   Post-operative Plan:   Informed Consent: I have reviewed the patients History and Physical, chart, labs and discussed the procedure including the risks, benefits and alternatives for the proposed anesthesia with the patient or authorized representative who has indicated his/her understanding and acceptance.       Plan Discussed with:   Anesthesia Plan Comments:        Anesthesia Quick Evaluation

## 2023-09-14 NOTE — Progress Notes (Signed)
 Patient ID: Yesenia Joseph, female   DOB: 1996-06-17, 28 y.o.   MRN: [id number] Pt still comfortable. No complaints.  VSS EFM - 130, moderate variability, no decels or accels TOCO - ctxs difficult to monitor  SVE 7-8/70/-2  A/P: Progressing well on pitocin since AROM         IUPC placed         Expectant mgmt

## 2023-09-15 ENCOUNTER — Encounter (HOSPITAL_COMMUNITY): Payer: Self-pay | Admitting: Obstetrics and Gynecology

## 2023-09-15 LAB — CBC
HCT: 34.2 % — ABNORMAL LOW (ref 36.0–46.0)
Hemoglobin: 11.4 g/dL — ABNORMAL LOW (ref 12.0–15.0)
MCH: 29.3 pg (ref 26.0–34.0)
MCHC: 33.3 g/dL (ref 30.0–36.0)
MCV: 87.9 fL (ref 80.0–100.0)
Platelets: 208 10*3/uL (ref 150–400)
RBC: 3.89 MIL/uL (ref 3.87–5.11)
RDW: 13.7 % (ref 11.5–15.5)
WBC: 14.2 10*3/uL — ABNORMAL HIGH (ref 4.0–10.5)
nRBC: 0 % (ref 0.0–0.2)

## 2023-09-15 LAB — BIRTH TISSUE RECOVERY COLLECTION (PLACENTA DONATION)

## 2023-09-15 MED ORDER — IBUPROFEN 200 MG PO TABS: 600.0000 mg | ORAL_TABLET | Freq: Four times a day (QID) | ORAL | Status: AC | PRN

## 2023-09-15 MED ORDER — ACETAMINOPHEN 325 MG PO TABS: 650.0000 mg | ORAL_TABLET | Freq: Four times a day (QID) | ORAL | Status: AC | PRN

## 2023-09-15 NOTE — Anesthesia Postprocedure Evaluation (Signed)
 Anesthesia Post Note  Patient: Yesenia Joseph  Procedure(s) Performed: AN AD HOC LABOR EPIDURAL     Patient location during evaluation: Mother Baby Anesthesia Type: Epidural Level of consciousness: awake and alert and oriented Pain management: satisfactory to patient Vital Signs Assessment: post-procedure vital signs reviewed and stable Respiratory status: respiratory function stable Cardiovascular status: stable Postop Assessment: no headache, no backache, epidural receding, patient able to bend at knees, no signs of nausea or vomiting, adequate PO intake and able to ambulate Anesthetic complications: no   No notable events documented.  Last Vitals:  Vitals:   09/14/23 2340 09/15/23 0400  BP: 101/60 95/65  Pulse: 67 75  Resp: 17 17  Temp: 36.4 C 36.5 C  SpO2: 99% 98%    Last Pain:  Vitals:   09/15/23 0519  TempSrc:   PainSc: 0-No pain   Pain Goal:                   [name]

## 2023-09-15 NOTE — Progress Notes (Signed)
 Post Partum Day 1 Subjective: Patient is doing well this morning. Pain is controlled. Ambulating, voiding, tolerating PO. Minimal lochia. Breastfeeding.   Objective: Patient Vitals for the past 24 hrs:  BP Temp Temp src Pulse Resp SpO2  09/15/23 0842 126/81 98.4 F (36.9 C) Oral 86 18 99 %  09/15/23 0400 95/65 97.7 F (36.5 C) Oral 75 17 98 %  09/14/23 2340 101/60 97.6 F (36.4 C) Oral 67 17 99 %  09/14/23 1940 104/64 97.8 F (36.6 C) Oral 72 17 98 %  09/14/23 1840 (!) 100/57 98.2 F (36.8 C) Oral 80 16 100 %  09/14/23 1800 104/77 -- -- 70 -- --  09/14/23 1746 (!) 101/54 -- -- 68 -- --  09/14/23 1731 110/76 -- -- 86 -- --  09/14/23 1716 110/66 -- -- 77 -- --  09/14/23 1701 (!) 106/56 -- -- 79 -- --  09/14/23 1653 108/81 -- -- 98 -- --  09/14/23 1631 113/66 -- -- 78 -- --  09/14/23 1601 100/69 -- -- 69 -- --  09/14/23 1531 106/70 -- -- 76 -- --  09/14/23 1501 110/74 -- -- 87 -- --  09/14/23 1459 107/68 -- -- 75 -- --  09/14/23 1431 (!) 94/56 -- -- 75 -- --  09/14/23 1401 (!) 87/47 -- -- 72 -- --  09/14/23 1336 (!) 90/52 -- -- 76 -- --  09/14/23 1305 100/63 -- -- 79 -- --  09/14/23 1231 107/63 -- -- 81 -- --  09/14/23 1221 102/62 -- -- 84 -- --  09/14/23 1218 (!) 106/56 97.7 F (36.5 C) -- 84 -- --  09/14/23 1211 (!) 109/56 -- -- 77 -- --  09/14/23 1206 102/73 -- -- 72 -- --  09/14/23 1203 109/65 -- -- 83 -- --  09/14/23 1202 109/65 -- -- 83 -- --  09/14/23 1156 98/68 -- -- 90 -- --  09/14/23 1151 109/72 -- -- 85 -- --  09/14/23 1150 (!) 96/55 -- -- 87 -- --    Physical Exam:  General: alert, cooperative, and no distress Lochia: appropriate Uterine Fundus: firm DVT Evaluation: No evidence of DVT seen on physical exam.  Recent Labs    09/14/23 0618 09/15/23 0452  WBC 10.3 14.2*  HGB 12.2 11.4*  HCT 35.7* 34.2*  PLT 241 208    No results for input(s): "NA", "K", "CL", "CO2CT", "BUN", "CREATININE", "GLUCOSE", "BILITOT", "ALT", "AST", "ALKPHOS", "PROT",  "ALBUMIN" in the last 72 hours.  No results for input(s): "CALCIUM", "MG", "PHOS" in the last 72 hours.  No results for input(s): "PROTIME", "APTT", "INR" in the last 72 hours.  No results for input(s): "PROTIME", "APTT", "INR", "FIBRINOGEN" in the last 72 hours. Assessment/Plan: Yesenia Joseph 28 y.o. G3P3003 PPD#1 sp SVD 1. PPC: routine PP care 2. Rh pos 3. Dispo: discharge later today vs. tomorrow pending baby's disposition   LOS: 1 day   Charlett Nose 09/15/2023, 9:00 AM

## 2023-09-15 NOTE — Discharge Summary (Signed)
 Postpartum Discharge Summary  Date of Service updated 09/15/23      Patient Name: Yesenia Joseph DOB: [birth year] MRN: [id number]  Date of admission: 09/14/2023 Delivery date:09/14/2023 Delivering provider: [facility] Date of discharge: 09/15/2023  Admitting diagnosis: Cholestasis during pregnancy in third trimester [O26.643] Intrauterine pregnancy: [redacted]w[redacted]d     Secondary diagnosis:  Principal Problem:   Cholestasis during pregnancy in third trimester  Additional problems: none    Discharge diagnosis: Term Pregnancy Delivered                                              Post partum procedures: none Augmentation: AROM, Pitocin, Cytotec, and IP Foley Complications: None  Hospital course: Induction of Labor With Vaginal Delivery   28 y.o. yo G3P3003 at [redacted]w[redacted]d was admitted to the hospital 09/14/2023 for induction of labor.  Indication for induction: Cholestasis of pregnancy.  Patient had an labor course complicated bynothing Membrane Rupture Time/Date: 11:52 AM,09/14/2023  Delivery Method:Vaginal, Spontaneous Operative Delivery:N/A Episiotomy: None Lacerations:  None Details of delivery can be found in separate delivery note.  Patient had a postpartum course complicated bynothing. Patient is discharged home 09/15/23.  Newborn Data: Birth date:09/14/2023 Birth time:4:49 PM Gender:Female Living status:Living Apgars:9 ,9  Weight:2940 g  Magnesium Sulfate received: No BMZ received: No Rhophylac:N/A MMR:N/A T-DaP:Given prenatally Flu: Yes RSV Vaccine received: No Transfusion:No Immunizations administered: Immunization History  Administered Date(s) Administered   DTaP 12/05/1993, 02/17/1994, 04/17/1994, 11/27/1994, 10/17/1998   HIB (PRP-OMP) 12/05/1993, 02/17/1994, 04/17/1994, 11/27/1994   HPV Quadrivalent 03/18/2007, 05/19/2007, 10/28/2007   Hepatitis A 04/25/2009, 07/02/2010   Hepatitis B 12/05/1993, 04/17/1994, 10/05/1994   IPV 12/05/1993, 02/17/1994, 04/17/1994,  10/17/1998   Influenza Nasal 05/19/2007, 03/22/2009, 07/02/2010, 07/03/2011   MMR 11/27/1994, 10/17/1998   Meningococcal Conjugate 03/05/2006, 07/03/2011   Td 03/05/2006   Tdap 03/05/2006, 11/25/2018    Physical exam  Vitals:   09/14/23 1940 09/14/23 2340 09/15/23 0400 09/15/23 0842  BP: 104/64 101/60 95/65 126/81  Pulse: 72 67 75 86  Resp: 17 17 17 18   Temp: 97.8 F (36.6 C) 97.6 F (36.4 C) 97.7 F (36.5 C) 98.4 F (36.9 C)  TempSrc: Oral Oral Oral Oral  SpO2: 98% 99% 98% 99%  Weight:      Height:       General: alert, cooperative, and no distress Lochia: appropriate Uterine Fundus: firm Incision: N/A DVT Evaluation: No evidence of DVT seen on physical exam. Labs: Lab Results  Component Value Date   WBC 14.2 (H) 09/15/2023   HGB 11.4 (L) 09/15/2023   HCT 34.2 (L) 09/15/2023   MCV 87.9 09/15/2023   PLT 208 09/15/2023      Latest Ref Rng & Units 06/11/2021    2:07 PM  CMP  Glucose 70 - 99 mg/dL 92   BUN 6 - 23 mg/dL 10   Creatinine 9.14 - 1.20 mg/dL 7.82   Sodium 956 - 213 mEq/L 138   Potassium 3.5 - 5.1 mEq/L 3.8   Chloride 96 - 112 mEq/L 104   CO2 19 - 32 mEq/L 28   Calcium 8.4 - 10.5 mg/dL 9.4   Total Protein 6.0 - 8.3 g/dL 6.9   Total Bilirubin 0.2 - 1.2 mg/dL 0.5   Alkaline Phos 39 - 117 U/L 69   AST 0 - 37 U/L 19   ALT 0 - 35 U/L 19  Edinburgh Score:    09/15/2023    8:42 AM  Inocente Salles Postnatal Depression Scale Screening Tool  I have been able to laugh and see the funny side of things. 0  I have looked forward with enjoyment to things. 0  I have blamed myself unnecessarily when things went wrong. 0  I have been anxious or worried for no good reason. 0  I have felt scared or panicky for no good reason. 0  Things have been getting on top of me. 0  I have been so unhappy that I have had difficulty sleeping. 0  I have felt sad or miserable. 0  I have been so unhappy that I have been crying. 0  The thought of harming myself has occurred to me. 0   Edinburgh Postnatal Depression Scale Total 0      After visit meds:  Allergies as of 09/15/2023       Reactions   Anesthetics, Amide Nausea And Vomiting   Anesthetics, Ester Nausea And Vomiting   Anesthetics, Halogenated Nausea And Vomiting   Codeine Nausea And Vomiting        Medication List     STOP taking these medications    meloxicam 15 MG tablet Commonly known as: MOBIC       TAKE these medications    acetaminophen 325 MG tablet Commonly known as: Tylenol Take 2 tablets (650 mg total) by mouth every 6 (six) hours as needed for mild pain (pain score 1-3) or moderate pain (pain score 4-6).   ibuprofen 200 MG tablet Commonly known as: ADVIL Take 3 tablets (600 mg total) by mouth every 6 (six) hours as needed for cramping or moderate pain (pain score 4-6).   multivitamin tablet Take 1 tablet by mouth daily.         Discharge home in stable condition Infant Feeding: Breast Infant Disposition:home with mother Discharge instruction: per After Visit Summary and Postpartum booklet. Activity: Advance as tolerated. Pelvic rest for 6 weeks.  Diet: routine diet Anticipated Birth Control: Unsure Postpartum Appointment:6 weeks Additional Postpartum F/U:  none Future Appointments:No future appointments. Follow up Visit:  Follow-up Information     [facility]. Schedule an appointment as soon as possible for a visit in 6 week(s).   Contact information: [address]  [address] [place] [address] [phone]                     09/15/2023 Charlett Nose, MD

## 2023-09-15 NOTE — Lactation Note (Signed)
 This note was copied from a baby's chart. Lactation Consultation Note  Patient Name: Yesenia Joseph POEUM'P Date: 09/15/2023 Age:30 hours Reason for consult: Follow-up assessment;Early term 37-38.6wks;Maternal endocrine disorder  LC attempted to consult with mom, but mom is asleep in bed.  Maternal Data    Feeding Mother's Current Feeding Choice: Breast Milk      Discharge    Consult Status Consult Status: Follow-up Date: 09/16/23 Follow-up type: In-patient (LC attempted to see but mom is asleep)    Yesenia Joseph 09/15/2023, 2:03 PM

## 2023-09-22 ENCOUNTER — Telehealth (HOSPITAL_COMMUNITY): Payer: Self-pay | Admitting: *Deleted

## 2023-09-22 NOTE — Telephone Encounter (Signed)
 09/22/2023  Name: Yesenia Joseph MRN: [id number] DOB: 1995-11-25  Reason for Call:  Transition of Care Hospital Discharge Call  Contact Status: Patient Contact Status: Complete  Language assistant needed:          Follow-Up Questions: Do You Have Any Concerns About Your Health As You Heal From Delivery?: No Do You Have Any Concerns About Your Infants Health?: No  Edinburgh Postnatal Depression Scale:  In the Past 7 Days:   Patient declined at this time. Stated, "I feel really good and I have a great support system." PHQ2-9 Depression Scale:     Discharge Follow-up: Edinburgh score requires follow up?: N/A Patient was advised of the following resources:: Breastfeeding Support Group, Support Group  Post-discharge interventions: Reviewed Newborn Safe Sleep Practices  Signature Deforest Hoyles, RN, 09/22/23, [phone]

## 2023-12-30 ENCOUNTER — Ambulatory Visit (INDEPENDENT_AMBULATORY_CARE_PROVIDER_SITE_OTHER): Admitting: Family Medicine

## 2023-12-30 ENCOUNTER — Encounter: Payer: Self-pay | Admitting: Family Medicine

## 2023-12-30 VITALS — BP 111/67 | Ht 62.0 in | Wt 180.0 lb

## 2023-12-30 DIAGNOSIS — M25579 Pain in unspecified ankle and joints of unspecified foot: Secondary | ICD-10-CM

## 2023-12-30 DIAGNOSIS — M25571 Pain in right ankle and joints of right foot: Secondary | ICD-10-CM | POA: Diagnosis not present

## 2023-12-30 NOTE — Progress Notes (Signed)
 DATE OF VISIT: 12/30/2023        Yesenia Joseph DOB: 1996-04-27 MRN: [id number]  CC:  Orthotics  History of present Illness: Yesenia Joseph is a 28 y.o. female who presents for some orthotics Referred by [facility] Has been having chronic right foot pain for some time Had right fourth toe joint cortisone injection earlier today with [facility] No prior Orthotics  Medications:  Outpatient Encounter Medications as of 12/30/2023  Medication Sig   acetaminophen  (TYLENOL ) 325 MG tablet Take 2 tablets (650 mg total) by mouth every 6 (six) hours as needed for mild pain (pain score 1-3) or moderate pain (pain score 4-6).   ibuprofen  (ADVIL ) 200 MG tablet Take 3 tablets (600 mg total) by mouth every 6 (six) hours as needed for cramping or moderate pain (pain score 4-6).   Multiple Vitamin (MULTIVITAMIN) tablet Take 1 tablet by mouth daily.   No facility-administered encounter medications on file as of 12/30/2023.    Allergies: is allergic to anesthetics, amide; anesthetics, ester; anesthetics, halogenated; and codeine.  Physical Examination: Vitals: BP 111/67   Ht 5' 2 (1.575 m)   Wt 180 lb (81.6 kg)   BMI 32.92 kg/m  GENERAL:  Yesenia Joseph is a 28 y.o. female appearing their stated age, alert and oriented x 3, in no apparent distress.  SKIN: no rashes or lesions, skin clean, dry, intact MSK: Bilateral feet with widening of the transverse arches.  Has slight splaying between the 1st and 2nd toes on the left and slight splaying between the 2nd and 3rd toes on the right.  She has moderate longitudinal arches.  She has an adhesive bandage over the right fourth MTP joint where she received injection earlier today Walking without a limp Neurovascular intact distally  Assessment & Plan Pain in joint involving ankle and foot, unspecified laterality Chronic right foot pain, candidate for custom orthotics today  Plan: - Orthotics fabricated as noted below - Should  continue to follow-up with Ortho as scheduled - Can follow-up with us  on an as needed basis  Patient was fitted for a : Fit 'n Run semi-rigid orthotic  The orthotic was heated, placed on the orthotic stand. The patient was positioned in subtalar neutral position and 10 degrees of ankle dorsiflexion in a weight bearing stance on the heated orthotic blank After completion of molding Blank: Fit 'n Run - size 6 Posting:  none Base: built-in base - Fit and Run  Orthotics were comfortable in the office today and she had more neutral alignment.    Patient expressed understanding & agreement with above.  Encounter Diagnosis  Name Primary?   Pain in joint involving ankle and foot, unspecified laterality Yes    No orders of the defined types were placed in this encounter.

## 2023-12-30 NOTE — Patient Instructions (Signed)
 Thank you for coming to see me today. It was a pleasure.   We made you new orthotics. Let us know if we need to add any extra cushioning or make changes.  If you have any questions or concerns, please do not hesitate to call the office at [phone].

## 2024-04-06 ENCOUNTER — Other Ambulatory Visit: Payer: Self-pay | Admitting: Otolaryngology

## 2024-04-10 LAB — SURGICAL PATHOLOGY
